# Patient Record
Sex: Male | Born: 1940 | Race: White | Hispanic: No | Marital: Married | Smoking: Never smoker
Health system: Southern US, Community
[De-identification: ages and names within clinical notes are randomized; demographics above are authoritative.]

## PROBLEM LIST (undated history)

## (undated) DIAGNOSIS — K219 Gastro-esophageal reflux disease without esophagitis: Secondary | ICD-10-CM

## (undated) DIAGNOSIS — E119 Type 2 diabetes mellitus without complications: Secondary | ICD-10-CM

## (undated) DIAGNOSIS — I700 Atherosclerosis of aorta: Secondary | ICD-10-CM

## (undated) DIAGNOSIS — I272 Pulmonary hypertension, unspecified: Secondary | ICD-10-CM

## (undated) DIAGNOSIS — F32A Depression, unspecified: Secondary | ICD-10-CM

## (undated) DIAGNOSIS — I4891 Unspecified atrial fibrillation: Secondary | ICD-10-CM

## (undated) DIAGNOSIS — I219 Acute myocardial infarction, unspecified: Secondary | ICD-10-CM

## (undated) DIAGNOSIS — R519 Headache, unspecified: Secondary | ICD-10-CM

## (undated) DIAGNOSIS — M21371 Foot drop, right foot: Secondary | ICD-10-CM

## (undated) DIAGNOSIS — N400 Enlarged prostate without lower urinary tract symptoms: Secondary | ICD-10-CM

## (undated) DIAGNOSIS — Z7901 Long term (current) use of anticoagulants: Secondary | ICD-10-CM

## (undated) DIAGNOSIS — G43909 Migraine, unspecified, not intractable, without status migrainosus: Secondary | ICD-10-CM

## (undated) DIAGNOSIS — I480 Paroxysmal atrial fibrillation: Secondary | ICD-10-CM

## (undated) DIAGNOSIS — Q6102 Congenital multiple renal cysts: Secondary | ICD-10-CM

## (undated) DIAGNOSIS — K449 Diaphragmatic hernia without obstruction or gangrene: Secondary | ICD-10-CM

## (undated) DIAGNOSIS — I714 Abdominal aortic aneurysm, without rupture, unspecified: Secondary | ICD-10-CM

## (undated) DIAGNOSIS — M51369 Other intervertebral disc degeneration, lumbar region without mention of lumbar back pain or lower extremity pain: Secondary | ICD-10-CM

## (undated) DIAGNOSIS — I499 Cardiac arrhythmia, unspecified: Secondary | ICD-10-CM

## (undated) DIAGNOSIS — I2510 Atherosclerotic heart disease of native coronary artery without angina pectoris: Secondary | ICD-10-CM

## (undated) DIAGNOSIS — G5731 Lesion of lateral popliteal nerve, right lower limb: Secondary | ICD-10-CM

## (undated) DIAGNOSIS — I209 Angina pectoris, unspecified: Secondary | ICD-10-CM

## (undated) DIAGNOSIS — K5790 Diverticulosis of intestine, part unspecified, without perforation or abscess without bleeding: Secondary | ICD-10-CM

## (undated) DIAGNOSIS — Z8719 Personal history of other diseases of the digestive system: Secondary | ICD-10-CM

## (undated) DIAGNOSIS — D126 Benign neoplasm of colon, unspecified: Secondary | ICD-10-CM

## (undated) DIAGNOSIS — I10 Essential (primary) hypertension: Secondary | ICD-10-CM

## (undated) DIAGNOSIS — M1990 Unspecified osteoarthritis, unspecified site: Secondary | ICD-10-CM

## (undated) DIAGNOSIS — E785 Hyperlipidemia, unspecified: Secondary | ICD-10-CM

## (undated) DIAGNOSIS — R51 Headache: Secondary | ICD-10-CM

## (undated) HISTORY — PX: CHOLECYSTECTOMY: SHX55

## (undated) HISTORY — PX: BACK SURGERY: SHX140

## (undated) HISTORY — PX: HERNIA REPAIR: SHX51

## (undated) HISTORY — PX: REPLACEMENT TOTAL KNEE: SUR1224

## (undated) HISTORY — PX: CORONARY ANGIOPLASTY: SHX604

## (undated) HISTORY — PX: INGUINAL HERNIA REPAIR: SUR1180

---

## 1999-01-11 ENCOUNTER — Encounter: Payer: Self-pay | Admitting: Neurosurgery

## 1999-01-11 ENCOUNTER — Ambulatory Visit (HOSPITAL_COMMUNITY): Admission: RE | Admit: 1999-01-11 | Discharge: 1999-01-11 | Payer: Self-pay | Admitting: Neurosurgery

## 1999-05-01 ENCOUNTER — Encounter: Payer: Self-pay | Admitting: Neurosurgery

## 1999-05-01 ENCOUNTER — Ambulatory Visit (HOSPITAL_COMMUNITY): Admission: RE | Admit: 1999-05-01 | Discharge: 1999-05-01 | Payer: Self-pay | Admitting: Neurosurgery

## 1999-05-05 ENCOUNTER — Encounter: Admission: RE | Admit: 1999-05-05 | Discharge: 1999-05-14 | Payer: Self-pay | Admitting: Anesthesiology

## 2004-06-16 ENCOUNTER — Ambulatory Visit: Payer: Self-pay

## 2006-05-21 ENCOUNTER — Ambulatory Visit: Payer: Self-pay

## 2006-06-09 ENCOUNTER — Ambulatory Visit: Payer: Self-pay | Admitting: Unknown Physician Specialty

## 2006-06-09 ENCOUNTER — Other Ambulatory Visit: Payer: Self-pay

## 2006-06-16 ENCOUNTER — Ambulatory Visit: Payer: Self-pay | Admitting: Unknown Physician Specialty

## 2006-07-25 ENCOUNTER — Ambulatory Visit: Payer: Self-pay | Admitting: Unknown Physician Specialty

## 2006-08-30 ENCOUNTER — Ambulatory Visit: Payer: Self-pay | Admitting: Unknown Physician Specialty

## 2006-09-01 ENCOUNTER — Inpatient Hospital Stay: Payer: Self-pay | Admitting: Unknown Physician Specialty

## 2006-09-05 ENCOUNTER — Other Ambulatory Visit: Payer: Self-pay

## 2009-11-24 ENCOUNTER — Ambulatory Visit: Payer: Self-pay | Admitting: Unknown Physician Specialty

## 2012-07-13 ENCOUNTER — Inpatient Hospital Stay: Payer: Self-pay | Admitting: Family Medicine

## 2012-07-13 LAB — COMPREHENSIVE METABOLIC PANEL
Albumin: 4 g/dL (ref 3.4–5.0)
Alkaline Phosphatase: 83 U/L (ref 50–136)
Anion Gap: 6 — ABNORMAL LOW (ref 7–16)
BUN: 16 mg/dL (ref 7–18)
Bilirubin,Total: 0.7 mg/dL (ref 0.2–1.0)
Calcium, Total: 8.5 mg/dL (ref 8.5–10.1)
Chloride: 107 mmol/L (ref 98–107)
Co2: 28 mmol/L (ref 21–32)
Creatinine: 0.85 mg/dL (ref 0.60–1.30)
EGFR (African American): >60
EGFR (Non-African Amer.): >60
Glucose: 153 mg/dL — ABNORMAL HIGH (ref 65–99)
Osmolality: 285 (ref 275–301)
Potassium: 3.9 mmol/L (ref 3.5–5.1)
SGOT(AST): 25 U/L (ref 15–37)
SGPT (ALT): 29 U/L (ref 12–78)
Sodium: 141 mmol/L (ref 136–145)
Total Protein: 6.7 g/dL (ref 6.4–8.2)

## 2012-07-13 LAB — CBC
HCT: 35.5 % — ABNORMAL LOW (ref 40.0–52.0)
HGB: 12.3 g/dL — ABNORMAL LOW (ref 13.0–18.0)
MCH: 32.8 pg (ref 26.0–34.0)
MCHC: 34.7 g/dL (ref 32.0–36.0)
MCV: 95 fL (ref 80–100)
Platelet: 161 10*3/uL (ref 150–440)
RBC: 3.75 10*6/uL — ABNORMAL LOW (ref 4.40–5.90)
RDW: 13.8 % (ref 11.5–14.5)
WBC: 5.5 10*3/uL (ref 3.8–10.6)

## 2012-07-13 LAB — CK TOTAL AND CKMB (NOT AT ARMC)
CK, Total: 174 U/L (ref 35–232)
CK-MB: 1.8 ng/mL (ref 0.5–3.6)

## 2012-07-13 LAB — TROPONIN I
Troponin-I: <0.02 ng/mL
Troponin-I: <0.02 ng/mL

## 2012-07-13 LAB — LIPASE, BLOOD: Lipase: 114 U/L (ref 73–393)

## 2012-07-16 LAB — COMPREHENSIVE METABOLIC PANEL
Albumin: 3.1 g/dL — ABNORMAL LOW (ref 3.4–5.0)
Alkaline Phosphatase: 62 U/L (ref 50–136)
Anion Gap: 5 — ABNORMAL LOW (ref 7–16)
BUN: 25 mg/dL — ABNORMAL HIGH (ref 7–18)
Bilirubin,Total: 0.4 mg/dL (ref 0.2–1.0)
Calcium, Total: 8.3 mg/dL — ABNORMAL LOW (ref 8.5–10.1)
Chloride: 105 mmol/L (ref 98–107)
Co2: 27 mmol/L (ref 21–32)
Creatinine: 0.99 mg/dL (ref 0.60–1.30)
EGFR (African American): >60
EGFR (Non-African Amer.): >60
Glucose: 268 mg/dL — ABNORMAL HIGH (ref 65–99)
Osmolality: 288 (ref 275–301)
Potassium: 4.9 mmol/L (ref 3.5–5.1)
SGOT(AST): 20 U/L (ref 15–37)
SGPT (ALT): 20 U/L (ref 12–78)
Sodium: 137 mmol/L (ref 136–145)
Total Protein: 6 g/dL — ABNORMAL LOW (ref 6.4–8.2)

## 2012-07-16 LAB — CBC WITH DIFFERENTIAL/PLATELET
Basophil #: 0 10*3/uL (ref 0.0–0.1)
Basophil %: 0.1 %
Eosinophil #: 0 10*3/uL (ref 0.0–0.7)
HGB: 11.4 g/dL — ABNORMAL LOW (ref 13.0–18.0)
Lymphocyte #: 0.7 10*3/uL — ABNORMAL LOW (ref 1.0–3.6)
MCH: 32.6 pg (ref 26.0–34.0)
Monocyte #: 0.5 x10 3/mm (ref 0.2–1.0)
RBC: 3.5 10*6/uL — ABNORMAL LOW (ref 4.40–5.90)

## 2012-07-19 ENCOUNTER — Ambulatory Visit: Payer: Self-pay | Admitting: Unknown Physician Specialty

## 2012-08-15 ENCOUNTER — Ambulatory Visit: Payer: Self-pay | Admitting: Surgery

## 2012-08-21 ENCOUNTER — Ambulatory Visit: Payer: Self-pay | Admitting: Surgery

## 2012-08-22 LAB — PATHOLOGY REPORT

## 2013-08-06 ENCOUNTER — Inpatient Hospital Stay: Payer: Self-pay | Admitting: Internal Medicine

## 2013-08-06 DIAGNOSIS — I214 Non-ST elevation (NSTEMI) myocardial infarction: Secondary | ICD-10-CM

## 2013-08-06 HISTORY — DX: Non-ST elevation (NSTEMI) myocardial infarction: I21.4

## 2013-08-06 LAB — BASIC METABOLIC PANEL
Anion Gap: 5 — ABNORMAL LOW (ref 7–16)
BUN: 13 mg/dL (ref 7–18)
CHLORIDE: 107 mmol/L (ref 98–107)
CREATININE: 0.88 mg/dL (ref 0.60–1.30)
Calcium, Total: 8.9 mg/dL (ref 8.5–10.1)
Co2: 30 mmol/L (ref 21–32)
EGFR (Non-African Amer.): >60
Glucose: 165 mg/dL — ABNORMAL HIGH (ref 65–99)
Osmolality: 287 (ref 275–301)
Potassium: 3.7 mmol/L (ref 3.5–5.1)
SODIUM: 142 mmol/L (ref 136–145)

## 2013-08-06 LAB — CBC
HCT: 37.2 % — ABNORMAL LOW (ref 40.0–52.0)
HGB: 12.8 g/dL — ABNORMAL LOW (ref 13.0–18.0)
MCH: 32.9 pg (ref 26.0–34.0)
MCHC: 34.4 g/dL (ref 32.0–36.0)
MCV: 96 fL (ref 80–100)
Platelet: 149 10*3/uL — ABNORMAL LOW (ref 150–440)
RBC: 3.88 10*6/uL — ABNORMAL LOW (ref 4.40–5.90)
RDW: 13.9 % (ref 11.5–14.5)
WBC: 4 10*3/uL (ref 3.8–10.6)

## 2013-08-06 LAB — TROPONIN I
TROPONIN-I: 0.28 ng/mL — AB
TROPONIN-I: 0.3 ng/mL — AB
Troponin-I: 0.14 ng/mL — ABNORMAL HIGH
Troponin-I: 0.31 ng/mL — ABNORMAL HIGH

## 2013-08-06 LAB — HEPATIC FUNCTION PANEL A (ARMC)
AST: 23 U/L (ref 15–37)
Albumin: 3.7 g/dL (ref 3.4–5.0)
Alkaline Phosphatase: 77 U/L
BILIRUBIN DIRECT: 0.2 mg/dL (ref 0.00–0.20)
BILIRUBIN TOTAL: 0.6 mg/dL (ref 0.2–1.0)
SGPT (ALT): 34 U/L (ref 12–78)
TOTAL PROTEIN: 6.7 g/dL (ref 6.4–8.2)

## 2013-08-06 LAB — CK TOTAL AND CKMB (NOT AT ARMC)
CK, TOTAL: 120 U/L
CK, Total: 124 U/L
CK, Total: 92 U/L
CK-MB: 2.6 ng/mL (ref 0.5–3.6)
CK-MB: 2.7 ng/mL (ref 0.5–3.6)
CK-MB: 1.3 ng/mL (ref 0.5–3.6)

## 2013-08-06 LAB — CK-MB
CK-MB: 2.9 ng/mL (ref 0.5–3.6)
CK-MB: 2.1 ng/mL (ref 0.5–3.6)
CK-MB: 1.7 ng/mL (ref 0.5–3.6)

## 2013-08-06 LAB — APTT
ACTIVATED PTT: 97.5 s — AB (ref 23.6–35.9)
Activated PTT: 37.5 secs — ABNORMAL HIGH (ref 23.6–35.9)
Activated PTT: 128.4 secs — ABNORMAL HIGH (ref 23.6–35.9)

## 2013-08-06 LAB — PROTIME-INR
INR: 1
Prothrombin Time: 13.4 secs (ref 11.5–14.7)

## 2013-08-06 LAB — LIPASE, BLOOD: LIPASE: 131 U/L (ref 73–393)

## 2013-08-06 LAB — CK: CK, Total: 103 U/L

## 2013-08-07 DIAGNOSIS — R079 Chest pain, unspecified: Secondary | ICD-10-CM

## 2013-08-07 HISTORY — PX: CORONARY ANGIOPLASTY WITH STENT PLACEMENT: SHX49

## 2013-08-07 LAB — LIPID PANEL
CHOLESTEROL: 155 mg/dL (ref 0–200)
HDL: 43 mg/dL (ref 40–60)
LDL CHOLESTEROL, CALC: 98 mg/dL (ref 0–100)
Triglycerides: 72 mg/dL (ref 0–200)
VLDL Cholesterol, Calc: 14 mg/dL (ref 5–40)

## 2013-08-07 LAB — PLATELET COUNT: PLATELETS: 144 10*3/uL — AB (ref 150–440)

## 2013-08-07 LAB — APTT: Activated PTT: 106.4 secs — ABNORMAL HIGH (ref 23.6–35.9)

## 2013-08-07 LAB — HEMOGLOBIN: HGB: 12.7 g/dL — ABNORMAL LOW (ref 13.0–18.0)

## 2013-08-07 LAB — CK-MB: CK-MB: 3.1 ng/mL (ref 0.5–3.6)

## 2013-08-08 LAB — BASIC METABOLIC PANEL
Anion Gap: 4 — ABNORMAL LOW (ref 7–16)
BUN: 20 mg/dL — AB (ref 7–18)
Calcium, Total: 8.1 mg/dL — ABNORMAL LOW (ref 8.5–10.1)
Chloride: 107 mmol/L (ref 98–107)
Co2: 30 mmol/L (ref 21–32)
Creatinine: 1.01 mg/dL (ref 0.60–1.30)
EGFR (African American): >60
Glucose: 165 mg/dL — ABNORMAL HIGH (ref 65–99)
OSMOLALITY: 288 (ref 275–301)
Potassium: 4.5 mmol/L (ref 3.5–5.1)
SODIUM: 141 mmol/L (ref 136–145)

## 2013-08-27 ENCOUNTER — Ambulatory Visit: Payer: Self-pay | Admitting: Unknown Physician Specialty

## 2013-12-21 ENCOUNTER — Other Ambulatory Visit: Payer: Self-pay | Admitting: Internal Medicine

## 2013-12-21 LAB — TROPONIN I: Troponin-I: <0.02 ng/mL

## 2014-06-05 ENCOUNTER — Ambulatory Visit: Payer: Self-pay | Admitting: General Practice

## 2014-06-12 ENCOUNTER — Ambulatory Visit: Payer: Self-pay | Admitting: General Practice

## 2014-06-19 ENCOUNTER — Inpatient Hospital Stay: Payer: Self-pay | Admitting: General Practice

## 2014-06-27 DIAGNOSIS — M1712 Unilateral primary osteoarthritis, left knee: Secondary | ICD-10-CM | POA: Diagnosis not present

## 2014-06-27 DIAGNOSIS — I10 Essential (primary) hypertension: Secondary | ICD-10-CM

## 2014-06-27 DIAGNOSIS — E119 Type 2 diabetes mellitus without complications: Secondary | ICD-10-CM | POA: Diagnosis not present

## 2014-06-27 DIAGNOSIS — N400 Enlarged prostate without lower urinary tract symptoms: Secondary | ICD-10-CM | POA: Diagnosis not present

## 2014-06-27 DIAGNOSIS — I2510 Atherosclerotic heart disease of native coronary artery without angina pectoris: Secondary | ICD-10-CM | POA: Diagnosis not present

## 2014-08-02 NOTE — Consult Note (Signed)
PATIENT NAME:  Joseph Mann, Joseph Mann MR#:  [id number] DATE OF BIRTH:  1941-04-05  DATE OF CONSULTATION:  07/14/2012  REFERRING PHYSICIAN:  Rochel Brome, MD CONSULTING PHYSICIAN:  Ocie Cornfield. Ouida Sills, MD  REASON FOR CONSULTATION:  Rash.  HISTORY OF PRESENT ILLNESS: Joseph Mann is a pleasant 74 year old male who was admitted to the hospital a day and a half ago, early in the morning, with severe abdominal pain. Thought to be cholecystitis.  Was having plans to have a cholecystectomy done when he developed more and more of a rash systemically. It was on his chest and his head on admission. It spread to his arms and back and legs as well. Notes he did have amoxicillin from a walk-in clinic that finished about the time of admission to the hospital. No known history of allergy to penicillin. Has a history of allergy to IVP dye causing headache. He has had that since he was here, but again rash was present prior to being here and did not have a rash present prior. He has had some swelling of his hand, particularly of his right hand, some in his left knuckles as well. He is breathing well, but he is having some dysphagia. Dysphagia seems to be abating somewhat. He can swallow his water in the room at this point. His abdominal pain is present, but not nearly as severe.   PAST MEDICAL HISTORY: 1.  Hypertension.  2.  BPH.  3.  Hypercholesterolemia.  4.  Migraines, controlled with avoidance.  5.  Diet-controlled diabetes, not on medications at this point.  PAST SURGICAL HISTORY: 1.  Bilateral hernia repair in 1998. 2.  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  FAMILY HISTORY: Positive for hypertension.  SOCIAL HISTORY: Married, 2 kids. No tobacco. No alcohol. He is retired.     PHYSICAL EXAMINATION: GENERAL: Pleasant, no acute distress. Some obvious rash is present. VITAL SIGNS: Blood pressure is 123/74, pulse 74 and regular, respirations 18 and temperature 99.  HEENT: Normocephalic, atraumatic. OP  clear. No lesions in his mouth. NECK:  No lymphadenopathy  No JVD.  CHEST:  Clear to auscultation and percussion and inspection. No stridor. No wheezing.  HEART: Regular rate and rhythm without murmurs or gallops.  ABDOMEN: Soft and flat. Mild epigastric and right upper quadrant tenderness. No rebound or guarding.  EXTREMITIES: No clubbing, cyanosis or edema.  SKIN: Diffuse macular rash with some target lesions, worse on arms, buttocks and upper legs.   LABORATORY AND DIAGNOSTICS:  Abdominal ultrasound with biliary sludge. Lipase normal. Chest x-ray has been normal. WBC is 5.5 and hemoglobin is 12.3.  ASSESSMENT AND PLAN:  Rash, likely erythema multiforme. Will give him higher dose steroids with Solu-Medrol, 125 mg q. 8 hours. Benadryl p.r.n. is fine. Watch him closely. Would not discharge to ensure his airway stays open, especially since he has some dysphagia, although again that seems to be clearing as well. Seems to be from the amoxicillin so will enter penicillin allergy going forward.  ____________________________ Ocie Cornfield. Ouida Sills, MD mwa:sb D: 07/14/2012 14:11:55 ET T: 07/14/2012 14:33:58 ET JOB#: [id number]  cc: Ocie Cornfield. Ouida Sills, MD, <Dictator> Kirk Ruths MD ELECTRONICALLY SIGNED 07/17/2012 17:46

## 2014-08-02 NOTE — Op Note (Signed)
PATIENT NAME:  Joseph Mann, Joseph Mann MR#:  [id number] DATE OF BIRTH:  1940-12-26  DATE OF PROCEDURE:  08/21/2012  PREOPERATIVE DIAGNOSIS: Chronic cholecystitis.   POSTOPERATIVE DIAGNOSIS: Chronic cholecystitis, cholelithiasis.   PROCEDURE: Laparoscopic cholecystectomy, cholangiogram.   SURGEON: Rochel Brome, M.D.   ANESTHESIA: General.   INDICATIONS: This 73 year old male has a history of right upper quadrant pain, ultrasound findings of sludge, and surgery was recommended for definitive treatment.   DESCRIPTION OF PROCEDURE: The patient was placed on the operating table in the supine position under general anesthesia. The abdomen was prepared with ChloraPrep and draped in a sterile manner. A short incision was made in the inferior aspect of the umbilicus and carried down to the deep fascia. There was an umbilical hernia identified. This sac was dissected free from surrounding structures. The sac itself was approximately 1.5 cm in dimension. The fascial ring defect was approximately 7 mm in dimension. The sac was inverted. The Veress needle was inserted. The peritoneal cavity was inflated with carbon dioxide. The Veress needle was removed. The 10 mm cannula was inserted. The 10 mm, 0 degree laparoscope was inserted to view the peritoneal cavity. The liver appeared normal. There were no other specific findings with a brief survey of the abdomen. The patient was placed in the reverse Trendelenburg position and several degrees to the left. Next, an incision was made in the epigastrium slightly to the right of the midline to introduce an 11 mm cannula. Two incisions were made in the lateral aspect of the right upper quadrant to introduce two 5 mm cannulas.   The gallbladder was dissected. It was retracted towards the right shoulder. Multiple adhesions were taken down with blunt and sharp dissection. The infundibulum was retracted inferiorly and laterally. The cystic duct was dissected free from surrounding  structures and appeared to have the cystic artery in close proximity. The porta hepatis was demonstrated. The neck of the gallbladder was mobilized with blunt and sharp dissection. A critical view of safety was demonstrated. An Endo Clip was placed across the cystic duct and across the cystic artery adjacent to the neck of the gallbladder. An incision was made in the cystic duct to introduce a Reddick catheter. Half-strength Conray 60 dye was injected as the cholangiogram was done with fluoroscopy, viewing the biliary tree and flow of dye into the duodenum. No retained stones were seen. The Reddick catheter was removed. The cystic duct was doubly ligated with Endo Clips and divided. This also included the cystic artery. Next, the gallbladder was dissected free from surrounding structures. One other point that may be ducts of Luschka was controlled with Endo Clip and divided. The gallbladder had some bile leakage, which was aspirated, and the gallbladder was subsequently completely dissected free from the liver. The site was irrigated with heparinized saline solution and aspirated. The gallbladder was placed into an Endo Catch bag and delivered out through the umbilical port site. It did contain some palpable small stones and was submitted in formalin for routine pathology. The right upper quadrant was further inspected, irrigated and aspirated. The cannulas were removed. Carbon dioxide was allowed to escape from the peritoneal cavity. The fascial defect at the umbilicus was closed with a 0 Maxon inverted figure-of-eight suture and 1 additional inverted simple suture. Next, the skin incisions were closed with interrupted 5-0 chromic subcuticular sutures, benzoin and Steri-Strips. Dressings were applied with paper tape. The patient tolerated surgery satisfactorily and was moved to the recovery room for postoperative care.   ____________________________  Loreli Dollar, MD jws:jm D: 08/21/2012 11:05:34  ET T: 08/21/2012 13:46:39 ET JOB#: [id number]  cc: Loreli Dollar, MD, <Dictator> Loreli Dollar MD ELECTRONICALLY SIGNED 08/23/2012 17:53

## 2014-08-02 NOTE — H&P (Signed)
PATIENT NAME:  Joseph Mann, Joseph Mann MR#:  [id number] DATE OF BIRTH:  04/01/1941  DATE OF ADMISSION:  07/13/2012  HISTORY OF PRESENT ILLNESS: This 73 year old male came in emergently to the Emergency Room with chief complaint of pain, which he points to the right posterolateral lower ribcage, which also extended up into the right upper quadrant of the abdomen. He described this as a severe pain, which has been colicky. He has also had associated anorexia, nausea and dry heaves, but no vomiting. Has had some indigestion, not heartburn. He reports no chills or fever. Says the pain started yesterday. He had had some diarrhea during the course of the day, which eventually resolved, and by 11 o'clock last night, the pain was quite severe. He has had been evaluated by the Emergency Room staff and has been given some intravenous morphine, which helped. He was also given nitroglycerin, which did not help and also had been given Zofran, which helped. Has had ultrasound of the gallbladder, which I have reviewed the images, which demonstrate sludge in the dependant portion of the gallbladder. He also had a chest x-ray, which I have reviewed, which shows clear lung fields. He has had a CT scan of the chest and abdomen with contrast, which showed no evidence of any large vessel dissection.   PAST MEDICAL HISTORY: Include: 1.  Hypertension and is doing well on treatment.  2.  Hypercholesterolemia.  3.  History of headaches.  4.  History of hyperglycemia but not treated for diabetes.  5.  History of benign prostatic hypertrophy and history of prostatitis.  6.  Has had lumbar back surgery.  7.  Bilateral inguinal hernia repair.  8.  Left elbow surgery for nerve entrapment.   MEDICATIONS: Include:  1.  Aspirin 81 mg daily.  2.  Pravastatin 20 mg at bedtime.  3.  Viagra p.r.n.  4.  Cardura 8 mg at bedtime.   DRUG ALLERGIES: TO IVP DYE, LOVASTATIN, LIPITOR.   FAMILY HISTORY: Positive for hypertension and gallbladder  disease.   SOCIAL HISTORY: Accompanied by his wife and daughter. Does not smoke. Does not drink any alcohol. 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   REVIEW OF SYSTEMS: He reports that normally moves his bowels satisfactorily and voids satisfactorily. Had recently had some sinus congestion and took amoxicillin. Has developed a rash involving his arms, back and neck. Has taken some Benadryl, which helped. He reports no recent visual or auditory problems. No recent acute illness, such as cold or sore throat, but just the sinus infection. He reports no difficulty breathing with exertion. No chest pains. He reports he voids satisfactorily. He reports no ankle edema. No other recent sores or boils. Review of systems otherwise negative.   PHYSICAL EXAMINATION: GENERAL: He is awake, alert, oriented, resting on the ER stretcher.  VITAL SIGNS: Temperature is 97.8, pulse 52, respirations 18, blood pressure 119/67, weight 212, height 74 inches  SKIN: Warm and dry. Does have a macular splotched red rash involving arms, back and neck. Does have a palpable nodule left aspect of his back, which is about 2 cm with a central pore, possibly a sebaceous cyst. Also another nodule on the left side of his neck, which is about 1.2 cm, possibly another sebaceous cyst.  HEENT: His pupils are equal and reactive to light. Extraocular movements are intact. Sclerae clear. Pharynx clear.  NECK: With no other palpable mass other than that noted above.  LUNGS: Sounds are clear. No respiratory distress.  HEART: Regular  rhythm. S1 and S2.  ABDOMEN: Soft and flat. Has a very small umbilical hernia defect. Minimal degree of right upper quadrant tenderness. No palpable mass. No hepatomegaly.  EXTREMITIES: With no dependent edema.  NEUROLOGIC: Awake, alert, oriented. Moving all extremities.   RADIOLOGIC DATA: I reviewed his EKG, which has demonstrated sinus bradycardia.   LABORATORY DATA: I noted his lab work. His creatinine is  0.85. Liver panel normal. Cardiac enzymes normal. Hemoglobin 12.3. Ultrasound reviewed.   IMPRESSION: Cholecystitis, cholelithiasis.  RECOMMENDATIONS: I recommend admission to the hospital for IV fluids hydration and pain control, set him up for a laparoscopic cholecystectomy. I have discussed the operation, care, risks and benefits with him in detail, and we will get this scheduled.    ____________________________ Lenna Sciara. Rochel Brome, MD jws:lg D: 07/13/2012 13:01:06 ET T: 07/13/2012 13:40:06 ET JOB#: [id number]  cc: Loreli Dollar, MD, <Dictator> Ocie Cornfield. Ouida Sills, MD Loreli Dollar MD ELECTRONICALLY SIGNED 07/14/2012 17:50

## 2014-08-02 NOTE — Discharge Summary (Signed)
  DATE OF BIRTH:  August 04, 1940  DATE OF ADMISSION:  07/13/2012  DATE OF DISCHARGE:  07/16/2012  DISCHARGE DIAGNOSES: 1.  Right upper quadrant abdominal pain, likely cholecystitis.  2.  Acute allergic reaction, likely drug reaction due to amoxicillin.  3.  Hyperlipidemia.  4.  Benign prostatic hypertrophy.  5.  Hypertension.   DISCHARGE MEDICATIONS: 1.  Doxazosin 8 mg p.o. at bedtime.  2.  Pravastatin 20 mg p.o. at bedtime. 3.  Prednisone taper as directed.  4.  Hydroxyzine 25 mg p.o. q. 6 hours as needed for itching.  5.  Zyrtec 10 mg daily, as long as he had the allergic reaction symptoms.  6.  Benadryl 50 mg q. 8 hours as needed for itching.   CONSULTS:  Surgery.   PROCEDURES:  None.   PERTINENT LABS ON DAY OF DISCHARGE:  Sodium 137, potassium 4.9, creatinine 0.99, glucose 268. White blood cell count 11.5, hemoglobin 11.4, platelets 175. Cardiac enzymes were negative. AST 20, ALT 20, total bilirubin 0.4, alk phos is 62.   BRIEF HOSPITAL COURSE:  1.  Right upper quadrant abdominal pain: The patient initially came in with right upper quadrant abdominal pain concerning for possible cholecystitis. He was seen by Surgery and admitted for potential cholecystectomy. His ultrasound showed some sludge, but no gallbladder wall thickening, no gallstones, no signs of acute cholecystitis. His symptoms did improve. He was placed on a low fat diet and put on pain control. He did not have any further nausea or vomiting with this, and his abdominal pain significantly improved.   2.  During all of this, he developed an allergic reaction. He was previously on amoxicillin prior to admission, and it was thought that this was likely a drug reaction. He had both hives on the left thigh and the right arm. He was seen per request of Dr. Tamala Julian for medical consult by Dr. Frazier Richards. Dr. Ouida Sills placed him on high-dose Solu-Medrol IV, and his symptoms improved quickly. On day of discharge, his only  complaint was itching, but hives have resolved. Our plan here was to transition him over to a prednisone taper, which he will do, and cover his symptoms with hydroxyzine, Zyrtec and Benadryl as needed. Once his symptoms are resolved, he may consider further surgical workup per Dr. Tamala Julian. Will defer that to him.   DISPOSITION:  He is in stable condition to be discharged to home. Follow up with Dr. Ouida Sills as scheduled already. Follow up with Dr. Tamala Julian once his symptoms resolve.     Other chronic medical issues remain stable. No changes to those regimens.    ____________________________ Dion Body, MD kl:mr D: 07/16/2012 10:24:00 ET T: 07/16/2012 18:53:14 ET JOB#: [id number]  Dion Body, MD, <Dictator> cc:  Ocie Cornfield. Ouida Sills, MD J. Rochel Brome, MD    Lucianne Muss Netty Starring MD ELECTRONICALLY SIGNED 07/21/2012 10:01

## 2014-08-03 NOTE — H&P (Signed)
PATIENT NAME:  Joseph Mann, Joseph Mann MR#:  [id number] DATE OF BIRTH:  [birth year]  DATE OF ADMISSION:  08/06/2013  REFERRING PHYSICIAN: Dr. Beather Arbour.  PRIMARY CARE PHYSICIAN: Dr. Ouida Sills of [facility].   CHIEF COMPLAINT: Chest pain.   HISTORY OF PRESENT ILLNESS: A 74 year old Caucasian gentleman with a history of hypertension, pre-diabetes, hyperlipidemia, presenting with chest pain. Describes a 2-day duration of intermittent chest pain, retrosternal in location, radiation to the left arm associated shortness of breath. However, denies pain, nausea, diaphoresis, palpitations, orthopnea, or lower extremity edema. These episodes all occurred at rest. He had 3 episodes over a period of 2 days, each lasting about an hour in total, or possibly somewhat more. Each episode was worse in severity until finally this evening's episode where chest pain was 10/10 retrosternal with radiation to the left arm with associated shortness of breath. Thus presents to the emergency department for further workup and evaluation. He denies any worsening or relieving factors. He does, however, attest to some anginal symptoms with exertion at baseline.   REVIEW OF SYSTEMS:  CONSTITUTIONAL: Denies fever, fatigue, weakness. EYES: Denies blurry, double vision, or eye pain.  HEENT: Denies tinnitus, ear pain, hearing loss.  RESPIRATORY: Denies cough, wheeze, shortness of breath. CARDIOVASCULAR: Positive for chest pain as described above. Denies any palpitations, edema.  GASTROINTESTINAL: Positive for nausea, denies vomiting, diarrhea, abdominal pain.  GENITOURINARY: Denies dysuria or hematuria.  ENDOCRINE: Denies nocturia or thyroid problems.  HEMATOLOGIC AND LYMPHATIC: Denies easy bruising, bleeding. SKIN: Denies rash or lesions. MUSCULOSKELETAL: Denies pain in neck, back, shoulders, knees, or hips, or arthritic symptoms. NEUROLOGIC: Denies paralysis or paresthesias.  PSYCHIATRIC: Denies anxiety or depressive symptoms.    Otherwise, full review of systems performed by me is negative.   PAST MEDICAL HISTORY: Pre diabetes, hyperlipidemia, hypertension, BPH.   SOCIAL HISTORY: Denies any tobacco or drug usage. Positive for occasional alcohol usage.   FAMILY HISTORY: Positive for coronary artery disease, late onset.   ALLERGIES: AMOXICILLIN, CONTRAST DYE.   HOME MEDICATIONS: Include doxazosin 8 mg p.o. at bedtime, Norco 325/5 mg p.o. q. 6 hours, Prilosec 20 mg p.o. at bedtime, Zyrtec 10 mg p.o. daily.   PHYSICAL EXAMINATION:  VITAL SIGNS: Temperature 98.1, heart rate 53, respirations 12, blood pressure 147/80, saturating 96% on room air. Weight 97.5 kg, BMI 27.6.  GENERAL: Well-nourished, well-developed, Caucasian gentleman, currently in no acute distress.  HEAD: Normocephalic, atraumatic.  EYES: Pupils equal, round, reactive to light. Extraocular muscles intact. No scleral icterus.  MOUTH: Moist mucous membranes.  Dentition intact. No abscess noted.  EARS, NOSE, AND THROAT: Clear without exudates. No external lesions.  NECK: Supple. No thyromegaly. No nodules. No JVD.  PULMONARY: Clear to auscultation bilaterally without wheezes, rales, or rhonchi. No use of accessory muscles. Good respiratory effort.  CHEST:  Nontender on palpation.  CARDIOVASCULAR: S1, S2, bradycardic. No murmurs, rubs, or gallops. No edema. Pedal pulses 2+ bilaterally.  GASTROINTESTINAL: Soft, nontender, nondistended. There are no masses. Positive bowel sounds. No hepatosplenomegaly.  MUSCULOSKELETAL: No swelling, clubbing, or edema. Range of motion is full in all extremities. NEUROLOGIC: Cranial nerves II through XII intact. No gross neurologic deficits. Sensation intact. Reflexes intact.  SKIN: No ulcerations, lesions, no rashes, or cyanosis. Skin warm and dry. Turgor intact. PSYCHIATRIC: Mood and affect within normal limits. The patient alert and oriented x 3. Insight and judgment intact.   LABORATORY DATA: EKG performed revealing  sinus bradycardia without ST or T wave abnormalities. Chest x-ray performed revealing no acute cardiopulmonary process.  Remainder of laboratory data: Sodium 142, potassium 3.7, chloride 107, bicarbonate 30, BUN 13, creatinine 0.88, glucose 165. LFTs within normal limits. Troponin I 0.14. WBC 4, hemoglobin 12.8, platelets of 149,000. INR of 1, PTT is 37.5.   ASSESSMENT AND PLAN: A 74 year old gentleman presenting with chest pain. 1. Non-ST segment elevation myocardial infarction, received aspirin continue aspirin and statin therapy, placed on cardiac telemetry. Trend cardiac enzymes x 3. Check transthoracic echocardiogram. Continue anticoagulation with heparin drip and consult cardiology with [facility].  2. Hyperglycemia, insulin sliding scale with q. 6-hour Accu-Cheks.  3. Hyperlipidemia. Continue with statin therapy. 4. Venous thromboembolism prophylaxis on a heparin drip. The patient is full code.   TIME SPENT: 45 minutes.    ____________________________ Aaron Mose. Zulay Corrie, MD dkh:lt D: 08/06/2013 02:00:12 ET T: 08/06/2013 06:29:12 ET JOB#: [id number]  cc: Aaron Mose. Chakira Jachim, MD, <Dictator> Kenzel Ruesch Woodfin Ganja MD ELECTRONICALLY SIGNED 08/06/2013 20:29

## 2014-08-03 NOTE — Consult Note (Signed)
Chief Complaint:  Subjective/Chief Complaint Pt doing great this am. Denies cp or sob. He has ambulated well in hall . No groin trouble. Ready to go home.   VITAL SIGNS/ANCILLARY NOTES: **Vital Signs.:   29-Apr-15 08:00  Vital Signs Type Routine  Temperature Temperature (F) 97.9  Celsius 36.6  Temperature Source oral  Pulse Pulse 54  Pulse source if not from Vital Sign Device per cardiac monitor  Respirations Respirations 17  Systolic BP Systolic BP 045  Diastolic BP (mmHg) Diastolic BP (mmHg) 70  Mean BP 84  BP Source  if not from Vital Sign Device non-invasive  Pulse Ox % Pulse Ox % 98  Pulse Ox Activity Level  At rest  Oxygen Delivery Room Air/ 21 %  Pulse Ox Heart Rate 56  *Intake and Output.:   29-Apr-15 08:30  Grand Totals Intake:  240 Output:  200    Net:  40 24 Hr.:  40  Oral Intake      In:  240  Urine ml     Out:  200  Urinary Method  Urinal   Brief Assessment:  GEN well developed, well nourished, no acute distress, cachectic   Cardiac Regular  no murmur  -- LE edema  -- JVD   Respiratory normal resp effort  clear BS   Gastrointestinal Normal   Gastrointestinal details normal Soft  Nontender  Nondistended   EXTR negative cyanosis/clubbing, negative edema   Lab Results: Cardiology:  28-Apr-15 06:57   Ventricular Rate 56  Atrial Rate 56  P-R Interval 206  QRS Duration 92  QT 470  QTc 453  P Axis 43  R Axis 27  T Axis -11  ECG interpretation Sinus bradycardia T wave abnormality, consider anterolateral ischemia Abnormal ECG When compared with ECG of 05-Aug-2013 23:53, T wave inversion now evident in Anterior leads Confirmed by Rockey Situ, TIMOTHY ([id number]) on 08/08/2013 7:52:06 AM  Overreader: Ida Rogue    12:52   Ventricular Rate 49  Atrial Rate 49  P-R Interval 200  QRS Duration 94  QT 494  QTc 446  P Axis 56  R Axis 62  T Axis -21  ECG interpretation Sinus bradycardia T wave abnormality, consider inferior ischemia T wave  abnormality, consider anterolateral ischemia Abnormal ECG When compared with ECG of 07-Aug-2013 06:57, No significant change was found ----------unconfirmed----------  Confirmed by OVERREAD, NOT ([id number]), editor PEARSON, BARBARA ([id number]) on 08/07/2013 3:31:36 PM  Routine Chem:  28-Apr-15 03:38   Cholesterol, Serum 155  Triglycerides, Serum 72  HDL (INHOUSE) 43  VLDL Cholesterol Calculated 14  LDL Cholesterol Calculated 98 (Result(s) reported on 07 Aug 2013 at 04:12AM.)  29-Apr-15 03:44   Glucose, Serum  165  BUN  20  Creatinine (comp) 1.01  Sodium, Serum 141  Potassium, Serum 4.5  Chloride, Serum 107  CO2, Serum 30  Calcium (Total), Serum  8.1  Anion Gap  4  Osmolality (calc) 288  eGFR (African American) >60  eGFR (Non-African American) >60 (eGFR values <33m/min/1.73 m2 may be an indication of chronic kidney disease (CKD). Calculated eGFR is useful in patients with stable renal function. The eGFR calculation will not be reliable in acutely ill patients when serum creatinine is changing rapidly. It is not useful in  patients on dialysis. The eGFR calculation may not be applicable to patients at the low and high extremes of body sizes, pregnant women, and vegetarians.)  Cardiac:  28-Apr-15 20:17   CPK-MB, Serum 3.1 (Result(s) reported on 07 Aug 2013 at 10:12PM.)  Routine Coag:  28-Apr-15 03:38   Activated PTT (APTT)  106.4 (A HCT value >55% may artifactually increase the APTT. In one study, the increase was an average of 19%. Reference: "Effect on Routine and Special Coagulation Testing Values of Citrate Anticoagulant Adjustment in Patients with High HCT Values." American Journal of Clinical Pathology 2006;126:400-405.)  Routine Hem:  28-Apr-15 03:38   Platelet Count (CBC)  144 (Result(s) reported on 07 Aug 2013 at 01:06PM.)  Hemoglobin (CBC)  12.7 (Result(s) reported on 07 Aug 2013 at 01:06PM.)   Radiology Results: XRay:    27-Apr-15 00:30, Chest Portable Single View   Chest Portable Single View   REASON FOR EXAM:    CP  COMMENTS:       PROCEDURE: DXR - DXR PORTABLE CHEST SINGLE VIEW  - Aug 06 2013 12:30AM     CLINICAL DATA:  Chest pain.    EXAM:  PORTABLE CHEST - 1 VIEW    COMPARISON:  Chest radiograph performed 07/13/2012    FINDINGS:  The lungs are well-aerated and clear. There is no evidence of focal  opacification, pleural effusion or pneumothorax.  The cardiomediastinal silhouette is within normal limits. No acute  osseous abnormalities are seen. Chronic right-sided rib deformities  are seen.     IMPRESSION:  No acute cardiopulmonary process seen.      Electronically Signed    By: Garald Balding M.D.    On: 08/06/2013 00:48         Verified By: JEFFREY . Radene Knee, M.D.,  Cardiology:    26-Apr-15 23:53, ED ECG  Ventricular Rate 63  Atrial Rate 63  P-R Interval 196  QRS Duration 88  QT 392  QTc 401  P Axis 40  R Axis 14  T Axis 1  ECG interpretation   Normal sinus rhythm  Nonspecific T wave abnormality  Abnormal ECG  When compared with ECG of 13-Jul-2012 09:07,  T wave inversion now evident in Inferior leads  Nonspecific T wave abnormality now evident in Anterior leads  ----------unconfirmed----------  Confirmed by OVERREAD, NOT ([id number]), editor PEARSON, BARBARA ([id number]) on 08/06/2013 3:25:59 PM  ED ECG     27-Apr-15 14:16, Echo Doppler  Echo Doppler   REASON FOR EXAM:      COMMENTS:       PROCEDURE: [place] - ECHO DOPPLER COMPLETE(TRANSTHOR)  - Aug 06 2013  2:16PM     RESULT: Echocardiogram Report    Patient Name:   GENTLE HOGE Date of Exam: 08/06/2013  Medical Rec #:  [id number]         Custom1:  Date of Birth:  1940/12/27      Height:       76.0 in  Patient Age:    74 years       Weight:       215.0 lb  Patient Gender: M              BSA:          2.28 m??    Indications: MI  Sonographer:    Sherrie Sport RDCS  Referring Phys: Valentino Nose, K    Summary:   1. Left ventricular ejection fraction, by visual estimation, is 70  to   75%.   2. Normal global left ventricular systolic function.   3. Mildly increased left ventricular septal thickness.   4. Mild aortic valve sclerosis without stenosis.   5. Mildly increased left ventricular posterior wall thickness.   6. Mild tricuspid regurgitation.  2D AND  M-MODE MEASUREMENTS (normal ranges within parentheses):  Left Ventricle:          Normal  IVSd (2D):      1.58 cm (0.7-1.1)  LVPWd (2D):     1.18 cm (0.7-1.1) Aorta/LA:                  Normal  LVIDd (2D):     4.54 cm (3.4-5.7) Aortic Root (2D): 3.40 cm (2.4-3.7)  LVIDs (2D):     2.54 cm           Left Atrium (2D): 5.00 cm (1.9-4.0)  LV FS (2D):     44.1 %   (>25%)  LV EF (2D):     75.4 %   (>50%)                                    Right Ventricle:                                    RVd (2D):        9.51 cm  LV DIASTOLIC FUNCTION:  MV Peak E: 0.55 m/s E/e' Ratio: 12.00  MV Peak A: 0.88 m/s Decel Time: 285 msec  E/A Ratio: 0.62  SPECTRAL DOPPLER ANALYSIS (where applicable):  Mitral Valve:  MV P1/2 Time: 82.65 msec  MV Area, PHT: 2.66 cm??  Aortic Valve: AoV Max Vel: 1.03 m/s AoV Peak PG: 4.3 mmHg AoV Mean PG:  LVOT Vmax: 1.11 m/s LVOT VTI:  LVOT Diameter: 2.10 cm  AoV Area, Vmax: 3.72 cm?? AoV Area, VTI:  AoV Area, Vmn:  Tricuspid Valve and PA/RV Systolic Pressure: TR Max Velocity: 2.37 m/s RA   Pressure: 5 mmHg RVSP/PASP: 27.4 mmHg  Pulmonic Valve:  PV Max Velocity: 1.54 m/s PV Max PG: 9.4 mmHg PV Mean PG:    PHYSICIAN INTERPRETATION:  Left Ventricle: The left ventricular internal cavity size was normal. LV   septal wall thickness was mildly increased. LV posterior wall thickness   was mildly increased. Global LV systolic function was normal. Left   ventricular ejection fraction, by visual estimation, is 70 to 75%.  Right Ventricle: The right ventricular size is normal. Global RV systolic   function is normal.  Left Atrium: The left atrium is normal in size.  Right Atrium: The right atrium is normal  in size.  Pericardium: There is no evidence of pericardial effusion.  Mitral Valve: The mitral valve is normal in structure. Trace mitral valve   regurgitation is seen.  Tricuspid Valve: The tricuspid valve is normal. Mild tricuspid   regurgitation is visualized. The tricuspid regurgitant velocity is 2.37   m/s, and with an assumed right atrial pressure of 5 mmHg, the estimated   right ventricular systolic pressure is normal at 27.4 mmHg.  Aortic Valve: The aortic valve is normal. Mild aortic valve sclerosis is   present, withno evidence of aortic valve stenosis. No evidence of aortic   valve regurgitation is seen.  Pulmonic Valve: The pulmonic valve is normal.    Crescent Mills MD  Electronically signed by Harvey MD  Signature Date/Time: 08/07/2013/7:04:59 AM  *** Final ***    IMPRESSION: .        Verified By: Yolonda Kida, M.D., MD    28-Apr-15 06:57, ECG  Ventricular Rate 56  Atrial Rate 56  P-R  Interval 206  QRS Duration 92  QT 470  QTc 453  P Axis 43  R Axis 27  T Axis -11  ECG interpretation   Sinus bradycardia  T wave abnormality, consider anterolateral ischemia  Abnormal ECG  When compared with ECG of 05-Aug-2013 23:53,  T wave inversion now evident in Anterior leads  Confirmed by Rockey Situ, TIMOTHY ([id number]) on 08/08/2013 7:52:06 AM    Overreader: Rockey Situ, TIMOTHY  ECG     28-Apr-15 12:52, ECG  Ventricular Rate 49  Atrial Rate 49  P-R Interval 200  QRS Duration 94  QT 494  QTc 446  P Axis 56  R Axis 62  T Axis -21  ECG interpretation   Sinus bradycardia  T wave abnormality, consider inferior ischemia  T wave abnormality, consider anterolateral ischemia  Abnormal ECG  When compared with ECG of 07-Aug-2013 06:57,  No significant change was found  ----------unconfirmed----------    Confirmed by OVERREAD, NOT ([id number]), editor PEARSON, BARBARA ([id number]) on 08/07/2013 3:31:36 PM  ECG    Assessment/Plan:  Assessment/Plan:  Assessment  IMP S/P NQMI CAD [place] S/P PCI stent to LAD DES Hyperlipidemia Borderline DM .   Plan PLAN asa 325 MG DAILY Plavix 75 mg po daily for1 yr B-blocker therpay Add low dose ACE as outpt for CAD Continue statin therapy , consider switching to Lipitor or Crestor No heavt lifting for 1-2 weeks Increase exercise daily in a few days F/U with cardiology 1 week.   Electronic Signatures: Lujean Amel D (MD)  (Signed 29-Apr-15 09:07)  Authored: Chief Complaint, VITAL SIGNS/ANCILLARY NOTES, Brief Assessment, Lab Results, Radiology Results, Assessment/Plan   Last Updated: 29-Apr-15 09:07 by Yolonda Kida (MD)

## 2014-08-03 NOTE — Consult Note (Signed)
Chief Complaint:  Subjective/Chief Complaint Pt is s/p cardiac cath and PCI today. He c/o of cp nausea but states that it is improving   VITAL SIGNS/ANCILLARY NOTES: **Vital Signs.:   28-Apr-15 17:00  Vital Signs Type Routine  Pulse Pulse 56  Respirations Respirations 10  Systolic BP Systolic BP 063  Diastolic BP (mmHg) Diastolic BP (mmHg) 52  Mean BP 72  Pulse Ox % Pulse Ox % 93  Pulse Ox Activity Level  At rest  Oxygen Delivery 2L  Pulse Ox Heart Rate 56  *Intake and Output.:   28-Apr-15 18:00  Grand Totals Intake:  222.4 Output:      Net:  222.4 24 Hr.:  1227.3  Oral Intake      In:  120  Percentage of Meal Eaten  75  Nitroglycerin      In:  2.4  IV (Primary)      In:  0  IV (Primary)      In:  100   Brief Assessment:  GEN well developed, well nourished, no acute distress   Cardiac Regular  no murmur  -- LE edema  -- JVD   Respiratory normal resp effort  clear BS   Gastrointestinal Normal   Gastrointestinal details normal Soft  Nontender  Nondistended  Bowel sounds normal   EXTR negative cyanosis/clubbing, negative edema   Additional Physical Exam Right groin is ok no issues no swelling or bleeding   Lab Results: LabObservation:  27-Apr-15 14:16   OBSERVATION Reason for Test  Cardiology:  27-Apr-15 14:16   Echo Doppler REASON FOR EXAM:     COMMENTS:     PROCEDURE: [place] - ECHO DOPPLER COMPLETE(TRANSTHOR)  - Aug 06 2013  2:16PM   RESULT: Echocardiogram Report  Patient Name:   Joseph Mann Date of Exam: 08/06/2013 Medical Rec #:  [id number]         Custom1: Date of Birth:  02/19/1941      Height:       76.0 in Patient Age:    74 years       Weight:       215.0 lb Patient Gender: M              BSA:          2.28 m??  Indications: MI Sonographer:    Sherrie Sport RDCS Referring Phys: Valentino Nose, K  Summary:  1. Left ventricular ejection fraction, by visual estimation, is 70 to  75%.  2. Normal global left ventricular systolic function.  3. Mildly  increased left ventricular septal thickness.  4. Mild aortic valve sclerosis without stenosis.  5. Mildly increased left ventricular posterior wall thickness.  6. Mild tricuspid regurgitation. 2D AND M-MODE MEASUREMENTS (normal ranges within parentheses): Left Ventricle:          Normal IVSd (2D):      1.58 cm (0.7-1.1) LVPWd (2D):     1.18 cm (0.7-1.1) Aorta/LA:                  Normal LVIDd (2D):     4.54 cm (3.4-5.7) Aortic Root (2D): 3.40 cm (2.4-3.7) LVIDs (2D):     2.54 cm           Left Atrium (2D): 5.00 cm (1.9-4.0) LV FS (2D):     44.1 %   (>25%) LV EF (2D):     75.4 %   (>50%)  Right Ventricle:                                   RVd (2D):        3.71 cm LV DIASTOLIC FUNCTION: MV Peak E: 0.55 m/s E/e' Ratio: 12.00 MV Peak A: 0.88 m/s Decel Time: 285 msec E/A Ratio: 0.62 SPECTRAL DOPPLER ANALYSIS (where applicable): Mitral Valve: MV P1/2 Time: 82.65 msec MV Area, PHT: 2.66 cm?? Aortic Valve: AoV Max Vel: 1.03 m/s AoV Peak PG: 4.3 mmHg AoV Mean PG: LVOT Vmax: 1.11 m/s LVOT VTI:  LVOT Diameter: 2.10 cm AoV Area, Vmax: 3.72 cm?? AoV Area, VTI:  AoV Area, Vmn: Tricuspid Valve and PA/RV Systolic Pressure: TR Max Velocity: 2.37 m/s RA  Pressure: 5 mmHg RVSP/PASP: 27.4 mmHg Pulmonic Valve: PV Max Velocity: 1.54 m/s PV Max PG: 9.4 mmHg PV Mean PG:  PHYSICIAN INTERPRETATION: Left Ventricle: The left ventricular internal cavity size was normal. LV  septal wall thickness was mildly increased. LV posterior wall thickness  was mildly increased. Global LV systolic function was normal. Left  ventricular ejection fraction, by visual estimation, is 70 to 75%. Right Ventricle: The right ventricular size is normal. Global RV systolic  function is normal. Left Atrium: The left atrium is normal in size. Right Atrium: The right atrium is normal in size. Pericardium: There is no evidence of pericardial effusion. Mitral Valve: The mitral valve is normal in  structure. Trace mitral valve  regurgitation is seen. Tricuspid Valve: The tricuspid valve is normal. Mild tricuspid  regurgitation is visualized. The tricuspid regurgitant velocity is 2.37  m/s, and with an assumed right atrial pressure of 5 mmHg, the estimated  right ventricular systolic pressure is normal at 27.4 mmHg. Aortic Valve: The aortic valve is normal. Mild aortic valve sclerosis is  present, withno evidence of aortic valve stenosis. No evidence of aortic  valve regurgitation is seen. Pulmonic Valve: The pulmonic valve is normal.  [place] MD Electronically signed by [place] MD Signature Date/Time: 08/07/2013/7:04:59 AM *** Final ***  IMPRESSION: .    Verified By: Yolonda Kida, M.D., MD  Routine Chem:  27-Apr-15 12:32   Result Comment TROPONIN - RESULTS VERIFIED BY REPEAT TESTING.  - ELEVATED TROPONIN PREVIOUSLY CALLED ON  - 08-06-13 AT [email or web address]  Result(s) reported on 06 Aug 2013 at 01:33PM.  Cardiac:  27-Apr-15 12:32   CK, Total 103 (39-308 NOTE: NEW REFERENCE RANGE  05/14/2013)  CPK-MB, Serum 2.1 (Result(s) reported on 06 Aug 2013 at 01:18PM.)  Troponin I  0.30 (0.00-0.05 0.05 ng/mL or less: NEGATIVE  Repeat testing in 3-6 hrs  if clinically indicated. >0.05 ng/mL: POTENTIAL  MYOCARDIAL INJURY. Repeat  testing in 3-6 hrs if  clinically indicated. NOTE: An increase or decrease  of 30% or more on serial  testing suggests a  clinically important change)    20:53   CK, Total 92 (39-308 NOTE: NEW REFERENCE RANGE  05/14/2013)  CPK-MB, Serum 1.3 (Result(s) reported on 06 Aug 2013 at 09:38PM.)   Radiology Results: XRay:    27-Apr-15 00:30, Chest Portable Single View  Chest Portable Single View   REASON FOR EXAM:    CP  COMMENTS:       PROCEDURE: DXR - DXR PORTABLE CHEST SINGLE VIEW  - Aug 06 2013 12:30AM     CLINICAL DATA:  Chest pain.    EXAM:  PORTABLE CHEST - 1 VIEW    COMPARISON:  Chest radiograph performed  07/13/2012    FINDINGS:  The lungs are well-aerated and clear. There is no evidence of focal  opacification, pleural effusion or pneumothorax.  The cardiomediastinal silhouette is within normal limits. No acute  osseous abnormalities are seen. Chronic right-sided rib deformities  are seen.     IMPRESSION:  No acute cardiopulmonary process seen.      Electronically Signed    By: Garald Balding M.D.    On: 08/06/2013 00:48         Verified By: JEFFREY . Radene Knee, M.D.,  Cardiology:    26-Apr-15 23:53, ED ECG  Ventricular Rate 63  Atrial Rate 63  P-R Interval 196  QRS Duration 88  QT 392  QTc 401  P Axis 40  R Axis 14  T Axis 1  ECG interpretation   Normal sinus rhythm  Nonspecific T wave abnormality  Abnormal ECG  When compared with ECG of 13-Jul-2012 09:07,  T wave inversion now evident in Inferior leads  Nonspecific T wave abnormality now evident in Anterior leads  ----------unconfirmed----------  Confirmed by OVERREAD, NOT ([id number]), editor PEARSON, BARBARA ([id number]) on 08/06/2013 3:25:59 PM  ED ECG     27-Apr-15 14:16, Echo Doppler  Echo Doppler   REASON FOR EXAM:      COMMENTS:       PROCEDURE: [place] - ECHO DOPPLER COMPLETE(TRANSTHOR)  - Aug 06 2013  2:16PM     RESULT: Echocardiogram Report    Patient Name:   Joseph Mann Date of Exam: 08/06/2013  Medical Rec #:  [id number]         Custom1:  Date of Birth:  Sep 29, 1940      Height:       76.0 in  Patient Age:    74 years       Weight:       215.0 lb  Patient Gender: M              BSA:          2.28 m??    Indications: MI  Sonographer:    Sherrie Sport RDCS  Referring Phys: Valentino Nose, K    Summary:   1. Left ventricular ejection fraction, by visual estimation, is 70 to   75%.   2. Normal global left ventricular systolic function.   3. Mildly increased left ventricular septal thickness.   4. Mild aortic valve sclerosis without stenosis.   5. Mildly increased left ventricular posterior wall thickness.   6. Mild  tricuspid regurgitation.  2D AND M-MODE MEASUREMENTS (normal ranges within parentheses):  Left Ventricle:          Normal  IVSd (2D):      1.58 cm (0.7-1.1)  LVPWd (2D):     1.18 cm (0.7-1.1) Aorta/LA:                  Normal  LVIDd (2D):     4.54 cm (3.4-5.7) Aortic Root (2D): 3.40 cm (2.4-3.7)  LVIDs (2D):     2.54 cm           Left Atrium (2D): 5.00 cm (1.9-4.0)  LV FS (2D):     44.1 %   (>25%)  LV EF (2D):     75.4 %   (>50%)                                    Right Ventricle:  RVd (2D):        3.71 cm  LV DIASTOLIC FUNCTION:  MV Peak E: 0.55 m/s E/e' Ratio: 12.00  MV Peak A: 0.88 m/s Decel Time: 285 msec  E/A Ratio: 0.62  SPECTRAL DOPPLER ANALYSIS (where applicable):  Mitral Valve:  MV P1/2 Time: 82.65 msec  MV Area, PHT: 2.66 cm??  Aortic Valve: AoV Max Vel: 1.03 m/s AoV Peak PG: 4.3 mmHg AoV Mean PG:  LVOT Vmax: 1.11 m/s LVOT VTI:  LVOT Diameter: 2.10 cm  AoV Area, Vmax: 3.72 cm?? AoV Area, VTI:  AoV Area, Vmn:  Tricuspid Valve and PA/RV Systolic Pressure: TR Max Velocity: 2.37 m/s RA   Pressure: 5 mmHg RVSP/PASP: 27.4 mmHg  Pulmonic Valve:  PV Max Velocity: 1.54 m/s PV Max PG: 9.4 mmHg PV Mean PG:    PHYSICIAN INTERPRETATION:  Left Ventricle: The left ventricular internal cavity size was normal. LV   septal wall thickness was mildly increased. LV posterior wall thickness   was mildly increased. Global LV systolic function was normal. Left   ventricular ejection fraction, by visual estimation, is 70 to 75%.  Right Ventricle: The right ventricular size is normal. Global RV systolic   function is normal.  Left Atrium: The left atrium is normal in size.  Right Atrium: The right atrium is normal in size.  Pericardium: There is no evidence of pericardial effusion.  Mitral Valve: The mitral valve is normal in structure. Trace mitral valve   regurgitation is seen.  Tricuspid Valve: The tricuspid valve is normal. Mild tricuspid   regurgitation  is visualized. The tricuspid regurgitant velocity is 2.37   m/s, and with an assumed right atrial pressure of 5 mmHg, the estimated   right ventricular systolic pressure is normal at 27.4 mmHg.  Aortic Valve: The aortic valve is normal. Mild aortic valve sclerosis is   present, withno evidence of aortic valve stenosis. No evidence of aortic   valve regurgitation is seen.  Pulmonic Valve: The pulmonic valve is normal.    Ramirez-Perez MD  Electronically signed by [place] MD  Signature Date/Time: 08/07/2013/7:04:59 AM  *** Final ***    IMPRESSION: .        Verified By: Yolonda Kida, M.D., MD    28-Apr-15 12:52, ECG  Ventricular Rate 49  Atrial Rate 49  P-R Interval 200  QRS Duration 94  QT 494  QTc 446  P Axis 56  R Axis 62  T Axis -21  ECG interpretation   Sinus bradycardia  T wave abnormality, consider inferior ischemia  T wave abnormality, consider anterolateral ischemia  Abnormal ECG  When compared with ECG of 07-Aug-2013 06:57,  No significant change was found  ----------unconfirmed----------    Confirmed by OVERREAD, NOT ([id number]), editor PEARSON, BARBARA ([id number]) on 08/07/2013 3:31:36 PM  ECG    Assessment/Plan:  Assessment/Plan:  Assessment IMP [place] NQMI CAD HTN Hyperlipidemia BPH Borderline DM .,   Plan PLAN ASA 325  mgpo daily Plavix 75 mg po daily x 1 yr B-blockers Low dose ACE Rec cardiac rehab Hopefully d/c home in the am Statin therapy Reduce activity for about 1-2 weeks F/U cardiology as outpt 1-2 weeks   Electronic Signatures: Lujean Amel D (MD)  (Signed 28-Apr-15 20:59)  Authored: Chief Complaint, VITAL SIGNS/ANCILLARY NOTES, Brief Assessment, Lab Results, Radiology Results, Assessment/Plan   Last Updated: 28-Apr-15 20:59 by Yolonda Kida (MD)

## 2014-08-03 NOTE — Discharge Summary (Signed)
PATIENT NAME:  Joseph Mann, Joseph Mann MR#:  [id number] DATE OF BIRTH:  12/14/1940  DATE OF ADMISSION:  08/06/2013 DATE OF PLANNED DISCHARGE:  08/08/2013   DISCHARGE DIAGNOSIS: Non-ST-elevated myocardial infarction.   DISCHARGE MEDICATIONS: Per [facility] med reconciliation. Briefly, will be on his usual home medications with the addition of Plavix 75 mg daily and metoprolol XL 25 mg once a day.   HISTORY AND PHYSICAL: Please see detailed history and physical done on admission.   HOSPITAL COURSE: The patient was admitted with chest pain, stuttering. He did rule in for MI by cardiac enzymes, including initial troponin of 0.14, and it increased to 0.28, though his CKs were normal. His troponin had a height of 0.31 before then decreasing. He continued to have stuttering chest pain during the hospitalization until he had his cardiac catheterization, which showed a 99% left anterior descending lesion, which was stented by Dr. Clayborn Bigness successfully. He will restart his metformin at home as his renal function does appear to be unchanged post cardiac catheterization substantially. He will be seen both by myself and Dr. Clayborn Bigness next week in the office. He knows to call with any further difficulties in the meantime.   TIME SPENT: Note, it took approximately 34 minutes to do all discharge tasks today for Mr. Joiner.   ____________________________ Ocie Cornfield. Ouida Sills, MD mwa:lb D: 08/08/2013 07:35:24 ET T: 08/08/2013 07:54:10 ET JOB#: [id number]  cc: Ocie Cornfield. Ouida Sills, MD, <Dictator> Kirk Ruths MD ELECTRONICALLY SIGNED 08/09/2013 8:05

## 2014-08-03 NOTE — Consult Note (Signed)
PATIENT NAME:  Joseph Mann, Joseph Mann MR#:  [id number] DATE OF BIRTH:  1941/03/25  DATE OF CONSULTATION:  08/06/2013  REFERRING PHYSICIAN:   CONSULTING PHYSICIAN:  Dwayne D. Clayborn Bigness, MD  PRIMARY CARE PHYSICIAN: Dr. Ouida Sills  INDICATION FOR ADMISSION:  Unstable angina.  HISTORY OF PRESENT ILLNESS: The patient is a 74 year old white male with a history of hypertension, pre diabetes, hyperlipidemia, and chest pain symptoms. Describes 2 days duration, intermittent chest pain, and anginal symptoms. The patient denies diaphoresis, palpitations, orthopnea and lower extremity edema. These episodes all occurred at rest. The patient had 3 or 4 episodes off and on, midsternal, 10/10 at times, with radiation to the left arm with occasional shortness of breath. He did notice some discomfort with activity. Also with walking he got more short of breath and chest burning and this is over the last several days to weeks as well so he finally was admitted and came for further evaluation.   REVIEW OF SYSTEMS: No blackout spells or syncope. No nausea, vomiting. Denies fever, chills, sweats. No weight loss or weight gain. No hemoptysis or hematemesis. No bright red blood per rectum. No vision change or hearing change. No sputum production or cough.   PAST MEDICAL HISTORY: Pre diabetes, hyperlipidemia, hypertension, BPH.  SOCIAL HISTORY: Occasional alcohol use. No smoking.   FAMILY HISTORY: Positive for coronary artery disease.   ALLERGIES: AMOXICILLIN AND CONTRAST DYE.  MEDICATIONS: Doxazosin 8 mg at bedtime, Norco 325/5 mg p.o. every 6 hours, Prilosec 20 mg at bedtime, Zyrtec 10 mg a day.   PHYSICAL EXAMINATION: VITAL SIGNS: Blood pressure 145/80, pulse 55, respiratory rate 14.  HEENT: Normocephalic, atraumatic. Pupils reactive to light.  NECK: Supple. No significant JVD, bruits, or adenopathy.  LUNGS: Clear to auscultation and percussion. No significant wheeze, rhonchi, or rale.  HEART: Regular rate and rhythm.  Positive bowel sounds. No rebound, guarding or tenderness.  EXTREMITIES: Within normal limits. No cyanosis, clubbing, or edema.  NEUROLOGIC: Intact.  SKIN: Normal.   DIAGNOSTIC DATA: EKG: Sinus bradycardia with nonspecific ST-T changes, low voltage.  Sodium 142, potassium 3.7, chloride 107, bicarb 30, BUN 13, creatinine 0.88, glucose 165. LFTs normal. Troponin 0.14. White count 4, hemoglobin 12.8, platelet count 149,000. PTT 37.5. INR 1.   ASSESSMENT: 1.  Non-Q-wave myocardial infarction.  2.  Elevated troponins. 3.  Unstable angina.  4.  Hyperglycemia.  5.  Hyperlipidemia.  6.  Gastroesophageal reflux disease.   PLAN:  1.  Continue current medications. Continue telemetry. Continue aspirin therapy. Agree with statin therapy. Follow up cardiac enzymes. Follow-up EKG. Agree with echocardiogram. Would recommend further evaluation, probably with a cardiac catheterization to definitively evaluate for significant coronary artery disease, unstable angina.  2.  Continue hyperlipidemia management.  3.  Follow up glucose therapy. 4.  Deep vein thrombosis prophylaxis.  5.  Zyrtec for sinus trouble.  6.  For benign prostatic hypertrophy, continue doxazosin. 7.  Base further evaluation on the results of cardiac catheterization.  ____________________________ Loran Senters Clayborn Bigness, MD ddc:sb D: 08/06/2013 14:52:09 ET T: 08/06/2013 15:22:39 ET JOB#: [id number]  cc: Dwayne D. Clayborn Bigness, MD, <Dictator> Yolonda Kida MD ELECTRONICALLY SIGNED 09/05/2013 16:24

## 2014-08-11 NOTE — Discharge Summary (Signed)
PATIENT NAME:  Joseph Joseph Mann, Joseph Mann MR#:  [id number] DATE OF BIRTH:  20-Apr-1940  DATE OF ADMISSION:  06/19/2014 DATE OF DISCHARGE:  06/22/2014 ADMITTING DIAGNOSIS: Degenerative arthrosis of the left knee.   DISCHARGE DIAGNOSIS: Degenerative arthrosis of the right knee.   HISTORY OF PRESENT ILLNESS: The patient is a 74 year old gentleman who has been followed at [facility] for progression of left knee pain. The patient had reported a many year history of progressive knee pain. The pain was localized along the medial aspect of the knee and was noted be aggravated with weight-bearing activities.  He had also reported some start-up stiffness and occasionally had some near giving way of the knee. Meloxicam provided only modest relief.  At the time of surgery, he was not using any ambulatory aid. The patient states that the pain had increased to the point that it was significantly interfering with his activities of daily living. X-rays taken in [place] showed narrowing of the medial cartilage space with bone-on-bone articulation being noted as well as being associated with varus alignment. Osteophyte and subchondral sclerosis were noted. After discussion of the risks and benefits of surgical intervention, the patient expressed his understanding of the risks and benefits and agreed for plans for surgical intervention.   PROCEDURE: Left total knee arthroplasty using computer-assisted navigation.   ANESTHESIA: Spinal.   SOFT TISSUE RELEASE: Anterior cruciate ligament, posterior cruciate ligament, deep and superficial medial collateral ligaments, as well as patellofemoral ligament.   IMPLANTS UTILIZED:  DePuy PFC Sigma size 6 posterior stabilized femoral component (cemented), size 6 MBT tibial component (cemented), 41 mm 3-pegged oval dome patella (cemented), and a 10 mm stabilized rotating platform polyethylene insert. Gentamicin bone cement was utilized due to the patient's history of  diabetes.   HOSPITAL COURSE: The patient tolerated the procedure very well. He had no complications. He was then taken to the PACU where he was stabilized and then transferred to the orthopedic floor. The patient began receiving anticoagulation therapy of Lovenox 30 mg subcutaneous every 12 hours per anesthesia and pharmacy protocol.  He was fitted with TED stockings bilaterally. These were allowed to be removed 1 hour per 8 hour shift. The TED stocking was applied to the left leg  day 2 after removal of the Hemovac and dressing change.  The patient was also fitted with AVI compression foot pumps bilaterally set at 80 mmHg.  His calves have been nontender. There has been no evidence of any DVTs. Negative Homan's sign. Heels were elevated off the bed using rolled towels.   The patient denied chest pain or shortness of breath. Vital signs have been stable. He has been afebrile. Hemodynamically, he is stable. No transfusions were given other than the AutoVac transfusion given the first 6 hours postoperatively.   Physical therapy was initiated on day 1 for gait training and transfers. He did exceptionally well. The first day he had 90 degrees of flexion and was ambulating approximately 100 feet. However, that night he was noted to have severe pain and had difficulty getting this under control. However, this was finally controlled and the patient was resting comfortably on day 2. Occupational therapy was also initiated on day 1 for ADL and assistive devices.   The patient's IV, Foley and Hemovac were discontinued on day 2 along with a dressing change. The wound was free of any drainage or signs of infection. Polar Care was reapplied to the surgical leg, maintaining a temperature of 40 to 50 degrees Fahrenheit.  DISPOSITION: The patient is being discharged to home in improved stable condition. He may continue to weight bear as tolerated. Continue using a rolling walker until cleared by physical therapy to go to  a quad cane.  He will receive home health physical therapy.  Continue to elevate the lower extremity. Continue TED stockings bilaterally. These are to be worn during the day but may be removed at night. Elevate heels off the bed. Encourage incentive spirometry every 1 hour while awake.  Encourage cough, deep breathing every 2 hours while awake.  He is placed on an ADA diet. Continue Polar Care around-the-clock, maintaining a temperature of 40 to 50 degrees Fahrenheit. He was instructed in wound care. He is not to get the dressing wet until the staples are removed in 2 weeks. He has a follow-up appointment with [facility] on 07/04/2014 at 8:45, sooner if any temperatures of 101.5 or greater or excessive bleeding.   The patient may resume his regular medications that he was on prior to admission. He was given a prescription for Roxicodone 5 to 10 mg every 4 to 6 hours p.r.n. for pain, tramadol 50 to 100 mg every 4 to 6 hours p.r.n. for pain and OxyContin 20 mg every 12 hours.  He was given a prescription for Lovenox 40 mg subcutaneously daily for 14 days, then discontinue and begin taking one 81 mg enteric-coated aspirin.   PAST MEDICAL HISTORY: Hypertension, diabetes, BPH, migraines, hyperlipidemia.     ____________________________ Vance Peper, PA jrw:DT D: 06/21/2014 07:29:00 ET T: 06/21/2014 13:37:38 ET JOB#: [id number]  cc: Vance Peper, PA, <Dictator> Joseph Meara PA ELECTRONICALLY SIGNED 07/02/2014 21:07

## 2014-08-11 NOTE — Discharge Summary (Signed)
PATIENT NAME:  Joseph Mann, Joseph Mann MR#:  [id number] DATE OF BIRTH:  1941/01/26  DATE OF ADMISSION:  06/19/2014 DATE OF DISCHARGE:  06/24/2014  ADDENDUM   On June 22, 2014, the patient developed urinary retention and was having poor progress with physical therapy. Unfortunately, he was unable to go home at this time. A Foley catheter had been placed and he was bladder trained. On the 13th of March, 2016, postop day 4, the patient continued to make slow progress with physical therapy and physical therapy had recommended the patient go to rehab for continuation of therapy and occupational therapy. He continued to retain urine and an in and out cath was performed. The patient was started on Flomax. Urinalysis was checked and was negative for any signs of infection. On the night of the 13th, the patient began to urinate and he had urinated several times on his own. On June 24, 2014, the patient was not having any issues with urinary retention. Due to slow progress with physical therapy, it is recommended he go to rehab for continuation of rehab.   CONDITION AT DISCHARGE: Stable.   NEW DISCHARGE INSTRUCTIONS: The patient may gradually increase weight-bearing on the effected extremity. Elevate the effected foot or leg on 2 pillows with the foot higher than the knee. Thigh-high TED hose on both legs and remove at bedtime, replace on arising the next morning. Elevate the heels off the bed. Use incentive spirometer every hour while awake and encourage cough and deep breathing. The patient may continue using Polar Care unit maintaining temp between 40 and 50 degrees. Do not get the dressing or bandage wet or dirty. Call [facility] orthopedics if the dressing gets water under it. Leave the dressing on. Call [facility] orthopedics if any of the following occur: Bright red bleeding from the incision wound, fever above 101.5 degrees, redness or swelling at the incision. Call [facility] orthopedics if you  experience any increased leg pain, numbness or weakness in your legs or bowel or bladder symptoms. The patient will go to rehab facility for physical therapy.   DISCHARGE MEDICATIONS: Please see list of discharge medications from discharge instructions.   ____________________________ T. Rachelle Hora, PA-C tcg:sb D: 06/24/2014 07:21:32 ET T: 06/24/2014 07:54:16 ET JOB#: [id number]  cc: T. Rachelle Hora, PA-C, <Dictator> Duanne Guess [place] ELECTRONICALLY SIGNED 07/03/2014 15:18

## 2014-08-11 NOTE — Op Note (Signed)
PATIENT NAME:  Joseph Mann, Joseph Mann MR#:  [id number] DATE OF BIRTH:  February 10, 1941  DATE OF PROCEDURE:  06/19/2014  PREOPERATIVE DIAGNOSIS: Degenerative arthrosis of the left knee.   POSTOPERATIVE DIAGNOSIS: Degenerative arthrosis of the left knee.   PROCEDURE PERFORMED: Left total knee arthroplasty using computer-assisted navigation.   SURGEON: Laurice Record. Holley Bouche., MD  ASSISTANT: Vance Peper, PA (required to maintain retraction throughout the procedure).   ANESTHESIA: Spinal.   ESTIMATED BLOOD LOSS: 50 mL.   FLUIDS REPLACED: Crystalloid 900 mL.   TOURNIQUET TIME: 92 minutes.   DRAINS: Two medium drains to reinfusion system.   SOFT TISSUE RELEASES: Anterior cruciate ligament, posterior cruciate ligament, deep and superficial medial collateral ligament, and patellofemoral ligament.   IMPLANTS UTILIZED: DePuy PFC Sigma size 6 posterior stabilized femoral component (cemented), size 6 MBT tibial component (cemented), 41 mm 3-peg oval dome patella (cemented), and a 10 mm stabilized rotating platform polyethylene insert. Gentamicin bone cement was utilized due to the patient's history of diabetes.   INDICATIONS FOR SURGERY: The patient is a 74 year old male who has been seen for complaints of persistent and progressive left knee pain. X-rays demonstrated severe degenerative changes in a tricompartmental fashion with relative varus deformity. After discussion of the risks and benefits of surgical intervention, the patient expressed understanding of the risks and benefits, and agreed with plans for surgical intervention.   PROCEDURE IN DETAIL: The patient was brought into the operating room and, after adequate spinal anesthesia was achieved, a tourniquet was placed on the patient's upper left thigh. The patient's left knee and leg were cleaned and prepped with alcohol and DuraPrep and draped in the usual sterile fashion. A "timeout" was performed as per usual protocol. The left lower extremity was  exsanguinated using an Esmarch, and the tourniquet was inflated to 300 mmHg. An anterior longitudinal incision was made followed by a standard mid vastus approach. A moderate effusion was evacuated. The deep fibers of the medial collateral ligament were elevated in a subperiosteal fashion off the medial flare of the tibia so as to maintain a continuous soft tissue sleeve. The patella was subluxed laterally and the patellofemoral ligament was incised. Inspection of the knee demonstrated severe degenerative changes with full-thickness loss of articular cartilage to the medial compartment. Prominent osteophytes were debrided using a rongeur. Anterior and posterior cruciate ligaments were excised. Two 4.0 mm Schanz pins were inserted into the femur and into the tibia for attachment of the array of trackers used for computer-assisted navigation. Hip center was identified using circumduction technique. Distal landmarks were mapped using the computer. The distal femur and proximal tibia were mapped using the computer. Distal femoral cutting guide and was positioned using computer-assisted navigation so as to achieve a 5 degree distal valgus cut. Cut was performed and verified using the computer. Distal femur was sized and it was felt that a size 6 femoral component was appropriate. A size 6 cutting guide was positioned and the anterior cut was performed and verified using the computer. This was followed by completion of the posterior and chamfer cuts. Femoral cutting guide for the central box was then positioned and the central box cut was performed. Attention was then directed to the proximal tibia. Medial and lateral menisci were excised. The extramedullary tibial cutting guide was positioned using computer-assisted navigation so as to achieve a 0 degree varus valgus alignment and 0 degree posterior slope. Cut was performed and verified using the computer. The proximal tibia was sized and it was felt that a  size 6 tibial  tray was appropriate. Tibial and femoral trials were inserted followed by insertion of a 10 mm polyethylene trial. The knee was felt to be tight medially. A Cobb elevator was used to elevate the superficial fibers of the medial collateral ligament. This allowed for excellent mediolateral soft tissue balancing both in full extension and in flexion. Finally, the patella was cut and prepared so as to accommodate a 41 mm 3-peg oval dome patella. Patellar trial was placed and the knee was placed through a range of motion with excellent patellar tracking appreciated.   Femoral component was removed after debridement of posterior osteophytes. A central post hole for the tibial component was reamed followed by insertion of a keel punch. Tibial trials were then removed. The cut surfaces of bone were irrigated with copious amounts of normal saline with antibiotic solution using pulsatile lavage and then suctioned dry. Polymethyl methacrylate cement with gentamicin was prepared in the usual fashion using a vacuum mixer. Cement was applied to the cut surface of the proximal tibia as well as along the undersurface of size 6 MBT tibial component. The tibial component was positioned and impacted into place. Excess cement was removed using [employer] fast streamer. Cement was then applied to the cut surface of the femur as well as on the posterior flanges of a size 6 posterior stabilized femoral component. Femoral component was positioned and impacted into place. Excess cement was removed using [employer] fast streamer. A 10 mm stabilized polyethylene trial was inserted and the knee was brought in full extension with steady axial compression applied. Finally, cement was applied to the backside of a 41 mm 3-peg oval dome patella and the patella component was positioned and patellar clamp applied. Excess cement was removed using [employer] fast streamer.   After adequate curing of cement, the tourniquet was deflated after total tourniquet time of 92  minutes. Hemostasis was achieved using electrocautery. The knee was irrigated with copious amounts of normal saline with antibiotic solution using pulsatile lavage and then suctioned dry; 20 mL of 1.3% Exparel in 40 mL of normal saline was then injected along the posterior capsule, medial and lateral recesses, and along the arthrotomy site. A 10 mm stabilized rotating platform polyethylene insert was inserted and the knee was placed through a range of motion with excellent mediolateral soft tissue balancing appreciated and excellent patellar tracking appreciated. Two medium drains were placed in the wound bed and brought out through a separate stab incision to be attached to a reinfusion system. The medial parapatellar portion of the incision was reapproximated using interrupted sutures of #1 Vicryl. The subcutaneous tissue was injected with 0.25% Marcaine with epinephrine. The subcutaneous tissue was then reapproximated in layers using first #0 Vicryl followed by 2-0 Vicryl. Skin was closed with skin staples. A sterile dressing was applied.   The patient tolerated the procedure well. He was transported to the recovery room in stable condition.    ____________________________ Laurice Record. Holley Bouche., MD jph:bm D: 06/19/2014 20:24:00 ET T: 06/20/2014 03:33:55 ET JOB#: [id number]  cc: Laurice Record. Holley Bouche., MD, <Dictator> Laurice Record Holley Bouche MD ELECTRONICALLY SIGNED 06/29/2014 16:49

## 2014-10-16 ENCOUNTER — Ambulatory Visit: Payer: Federal, State, Local not specified - PPO | Admitting: Internal Medicine

## 2015-02-10 ENCOUNTER — Emergency Department
Admission: EM | Admit: 2015-02-10 | Discharge: 2015-02-10 | Disposition: A | Discharge status: Home / Self Care | Payer: Federal, State, Local not specified - PPO | Attending: Emergency Medicine | Admitting: Emergency Medicine

## 2015-02-10 ENCOUNTER — Encounter: Payer: Self-pay | Admitting: Emergency Medicine

## 2015-02-10 DIAGNOSIS — E119 Type 2 diabetes mellitus without complications: Secondary | ICD-10-CM | POA: Diagnosis not present

## 2015-02-10 DIAGNOSIS — Z7902 Long term (current) use of antithrombotics/antiplatelets: Secondary | ICD-10-CM | POA: Diagnosis not present

## 2015-02-10 DIAGNOSIS — R040 Epistaxis: Secondary | ICD-10-CM

## 2015-02-10 DIAGNOSIS — Z7982 Long term (current) use of aspirin: Secondary | ICD-10-CM | POA: Insufficient documentation

## 2015-02-10 DIAGNOSIS — I10 Essential (primary) hypertension: Secondary | ICD-10-CM | POA: Diagnosis not present

## 2015-02-10 DIAGNOSIS — Z792 Long term (current) use of antibiotics: Secondary | ICD-10-CM | POA: Insufficient documentation

## 2015-02-10 DIAGNOSIS — Z79899 Other long term (current) drug therapy: Secondary | ICD-10-CM | POA: Insufficient documentation

## 2015-02-10 DIAGNOSIS — Z880 Allergy status to penicillin: Secondary | ICD-10-CM | POA: Diagnosis not present

## 2015-02-10 HISTORY — DX: Type 2 diabetes mellitus without complications: E11.9

## 2015-02-10 HISTORY — DX: Acute myocardial infarction, unspecified: I21.9

## 2015-02-10 HISTORY — DX: Essential (primary) hypertension: I10

## 2015-02-10 LAB — CBC
HCT: 36.5 % — ABNORMAL LOW (ref 40.0–52.0)
Hemoglobin: 12.5 g/dL — ABNORMAL LOW (ref 13.0–18.0)
MCH: 33 pg (ref 26.0–34.0)
MCHC: 34.3 g/dL (ref 32.0–36.0)
MCV: 96 fL (ref 80.0–100.0)
Platelets: 189 10*3/uL (ref 150–440)
RBC: 3.8 MIL/uL — AB (ref 4.40–5.90)
RDW: 13.5 % (ref 11.5–14.5)
WBC: 5.1 10*3/uL (ref 3.8–10.6)

## 2015-02-10 LAB — BASIC METABOLIC PANEL
ANION GAP: 8 (ref 5–15)
BUN: 18 mg/dL (ref 6–20)
CALCIUM: 8.8 mg/dL — AB (ref 8.9–10.3)
CO2: 25 mmol/L (ref 22–32)
Chloride: 107 mmol/L (ref 101–111)
Creatinine, Ser: 0.84 mg/dL (ref 0.61–1.24)
GFR calc Af Amer: >60 mL/min (ref >60)
GFR calc non Af Amer: >60 mL/min (ref >60)
Glucose, Bld: 216 mg/dL — ABNORMAL HIGH (ref 65–99)
POTASSIUM: 4.3 mmol/L (ref 3.5–5.1)
Sodium: 140 mmol/L (ref 135–145)

## 2015-02-10 LAB — GLUCOSE, CAPILLARY: Glucose-Capillary: 210 mg/dL — ABNORMAL HIGH (ref 65–99)

## 2015-02-10 MED ORDER — IBUPROFEN 600 MG PO TABS
600.0000 mg | ORAL_TABLET | Freq: Once | ORAL | Status: AC
  Administered 2015-02-10: 600 mg via ORAL
  Filled 2015-02-10: qty 1

## 2015-02-10 MED ORDER — ONDANSETRON HCL 4 MG/2ML IJ SOLN
INTRAMUSCULAR | Status: AC
  Administered 2015-02-10: 4 mg via INTRAVENOUS
  Filled 2015-02-10: qty 2

## 2015-02-10 MED ORDER — CLINDAMYCIN HCL 300 MG PO CAPS: 300.0000 mg | ORAL_CAPSULE | Freq: Three times a day (TID) | ORAL | Status: DC

## 2015-02-10 MED ORDER — HYDROCODONE-ACETAMINOPHEN 5-325 MG PO TABS: 1.0000 | ORAL_TABLET | ORAL | Status: DC | PRN

## 2015-02-10 MED ORDER — SODIUM CHLORIDE 0.9 % IV BOLUS (SEPSIS)
1000.0000 mL | Freq: Once | INTRAVENOUS | Status: AC
  Administered 2015-02-10: 1000 mL via INTRAVENOUS

## 2015-02-10 MED ORDER — HYDROCODONE-ACETAMINOPHEN 5-325 MG PO TABS
1.0000 | ORAL_TABLET | Freq: Once | ORAL | Status: AC
  Administered 2015-02-10: 1 via ORAL
  Filled 2015-02-10: qty 1

## 2015-02-10 MED ORDER — ONDANSETRON HCL 4 MG/2ML IJ SOLN
4.0000 mg | Freq: Once | INTRAMUSCULAR | Status: AC
  Administered 2015-02-10: 4 mg via INTRAVENOUS

## 2015-02-10 NOTE — Discharge Instructions (Signed)

## 2015-02-10 NOTE — ED Notes (Signed)
Pt presents to ED via EMS from personal home with c/o of epistaxis in left nostril. EMS states pt awoke with presenting sx this evening and noticed heavy bleeding coming out of left nostril. EMS states pt has a hx of HTN, DM, MI, and stent placement. EMS administered intranasal x2 afrin. Pt arrived alert and oriented x4.

## 2015-02-10 NOTE — ED Notes (Signed)
Bleeding stopped.  No acute distress noted.

## 2015-02-10 NOTE — ED Provider Notes (Signed)
[facility] Emergency Department Provider Note  ____________________________________________  Time seen: Approximately 732 AM  I have reviewed the triage vital signs and the nursing notes.   HISTORY  Chief Complaint Epistaxis    HPI Joseph Mann is a 74 y.o. male who comes in to the hospital today with epistaxis. The patient reports that at 4 AM he started bleeding out of his left nare. The patient reports that it has not stopped since the bleeding has started. The patient has been on Effient for the last 2 years. The patient reports that it does not matter how much he held his nose it would no stop bleeding. The patient never had this happen before. When EMS arrived his BP was 206/101. The patient reports that he was taken off of his blood pressure medicine 6 months ago. The patient reports that he was having dizziness at the time. The patient denies any chest pain, dizziness, SOB or any other symptoms.    Past Medical History  Diagnosis Date  . Hypertension   . Diabetes mellitus without complication ([place])   . MI (myocardial infarction) ([place])     There are no active problems to display for this patient.   Past Surgical History  Procedure Laterality Date  . Replacement total knee    . Back surgery    . Cholecystectomy    . Hernia repair      Current Outpatient Rx  Name  Route  Sig  Dispense  Refill  . aspirin EC 81 MG tablet   Oral   Take 81 mg by mouth daily.         Marland Kitchen loratadine (CLARITIN) 10 MG tablet   Oral   Take 10 mg by mouth daily.         . metFORMIN (GLUCOPHAGE-XR) 500 MG 24 hr tablet   Oral   Take 1,000 mg by mouth daily.         . prasugrel (EFFIENT) 10 MG TABS tablet   Oral   Take 10 mg by mouth daily.         . pravastatin (PRAVACHOL) 20 MG tablet   Oral   Take 20 mg by mouth at bedtime.         . clindamycin (CLEOCIN) 300 MG capsule   Oral   Take 1 capsule (300 mg total) by mouth 3 (three) times daily.    15 capsule   0   . HYDROcodone-acetaminophen (NORCO/VICODIN) 5-325 MG tablet   Oral   Take 1 tablet by mouth every 4 (four) hours as needed for moderate pain.   12 tablet   0     Allergies Amoxicillin and Plavix  No family history on file.  Social History Social History  Substance Use Topics  . Smoking status: Never Smoker   . Smokeless tobacco: None  . Alcohol Use: No    Review of Systems Constitutional: No fever/chills Eyes: No visual changes. ENT: No sore throat. Cardiovascular: Denies chest pain. Respiratory: Denies shortness of breath. Gastrointestinal: No abdominal pain.  No nausea, no vomiting.  No diarrhea.  No constipation. Genitourinary: Negative for dysuria. Musculoskeletal: Negative for back pain. Skin: Negative for rash. Neurological: Negative for headaches, focal weakness or numbness.  10-point ROS otherwise negative.  ____________________________________________   PHYSICAL EXAM:  VITAL SIGNS: ED Triage Vitals  Enc Vitals Group     BP 02/10/15 0535 154/88 mmHg     Pulse Rate 02/10/15 0535 64     Resp 02/10/15 0535  17     Temp 02/10/15 0535 98.1 F (36.7 C)     Temp Source 02/10/15 0535 Oral     SpO2 02/10/15 0535 98 %     Weight 02/10/15 0535 212 lb (96.163 kg)     Height 02/10/15 0535 6\' 2"  (1.88 m)     Head Cir --      Peak Flow --      Pain Score 02/10/15 0701 7     Pain Loc --      Pain Edu? --      Excl. in [place]? --     Constitutional: Alert and oriented. Well appearing and in no acute distress. Eyes: Conjunctivae are normal. PERRL. EOMI. Head: Atraumatic. Nose: No congestion/rhinnorhea. Mouth/Throat: Mucous membranes are moist.  Oropharynx non-erythematous. Cardiovascular: Normal rate, regular rhythm. Grossly normal heart sounds.  Good peripheral circulation. Respiratory: Normal respiratory effort.  No retractions. Lungs CTAB. Gastrointestinal: Soft and nontender. No distention. No abdominal bruits. No CVA  tenderness. Musculoskeletal: No lower extremity tenderness nor edema.  No joint effusions. Neurologic:  Normal speech and language. No gross focal neurologic deficits are appreciated. No gait instability. Skin:  Skin is warm, dry and intact. No rash noted. Psychiatric: Mood and affect are normal. Speech and behavior are normal.  ____________________________________________   LABS (all labs ordered are listed, but only abnormal results are displayed)  Labs Reviewed  CBC - Abnormal; Notable for the following:    RBC 3.80 (*)    Hemoglobin 12.5 (*)    HCT 36.5 (*)    All other components within normal limits  BASIC METABOLIC PANEL - Abnormal; Notable for the following:    Glucose, Bld 216 (*)    Calcium 8.8 (*)    All other components within normal limits  GLUCOSE, CAPILLARY - Abnormal; Notable for the following:    Glucose-Capillary 210 (*)    All other components within normal limits   ____________________________________________  EKG  none ____________________________________________  RADIOLOGY  none ____________________________________________   PROCEDURES  Procedure(s) performed: None  Critical Care performed: No  ____________________________________________   INITIAL IMPRESSION / ASSESSMENT AND PLAN / ED COURSE  Pertinent labs & imaging results that were available during my care of the patient were reviewed by me and considered in my medical decision making (see chart for details).  This is a 74 year old male who comes into the hospital today with epistaxis. When the patient arrived I went ahead and placed a nasal tampon with Murocel into his left knee there. The patient did have Afrin and I did inflate the tampon with Afrin as well. The patient did become a little dizzy and nauseous after the placement of the nasal tampon. I did give him a dose of Norco for pain as well as the Profen for headache. The patient did not have any significant bleeding after the  placement of the nasal tampon. The patient's blood pressure is improving while he is in the ED. He'll be discharged home to follow-up with ENT in 3-5 days. ____________________________________________   FINAL CLINICAL IMPRESSION(S) / ED DIAGNOSES  Final diagnoses:  Epistaxis      Loney Hering, MD 02/10/15 [phone]

## 2015-02-10 NOTE — ED Notes (Signed)
Nose clip placed on pt.

## 2015-02-10 NOTE — ED Notes (Signed)
MD Dahlia Client at bedside, completing medical evaluation.

## 2015-03-05 ENCOUNTER — Encounter: Payer: Self-pay | Admitting: *Deleted

## 2015-03-10 ENCOUNTER — Ambulatory Visit: Payer: Federal, State, Local not specified - PPO | Admitting: *Deleted

## 2015-03-10 ENCOUNTER — Ambulatory Visit
Admission: RE | Admit: 2015-03-10 | Discharge: 2015-03-10 | Disposition: A | Discharge status: Home / Self Care | Payer: Federal, State, Local not specified - PPO | Source: Ambulatory Visit | Attending: Unknown Physician Specialty | Admitting: Unknown Physician Specialty

## 2015-03-10 ENCOUNTER — Encounter: Payer: Self-pay | Admitting: *Deleted

## 2015-03-10 ENCOUNTER — Encounter
Admission: RE | Disposition: A | Discharge status: Home / Self Care | Payer: Self-pay | Source: Ambulatory Visit | Attending: Unknown Physician Specialty

## 2015-03-10 DIAGNOSIS — Z7984 Long term (current) use of oral hypoglycemic drugs: Secondary | ICD-10-CM | POA: Diagnosis not present

## 2015-03-10 DIAGNOSIS — E119 Type 2 diabetes mellitus without complications: Secondary | ICD-10-CM | POA: Insufficient documentation

## 2015-03-10 DIAGNOSIS — Z7982 Long term (current) use of aspirin: Secondary | ICD-10-CM | POA: Insufficient documentation

## 2015-03-10 DIAGNOSIS — I10 Essential (primary) hypertension: Secondary | ICD-10-CM | POA: Diagnosis not present

## 2015-03-10 DIAGNOSIS — D123 Benign neoplasm of transverse colon: Secondary | ICD-10-CM | POA: Insufficient documentation

## 2015-03-10 DIAGNOSIS — D125 Benign neoplasm of sigmoid colon: Secondary | ICD-10-CM | POA: Diagnosis not present

## 2015-03-10 DIAGNOSIS — K640 First degree hemorrhoids: Secondary | ICD-10-CM | POA: Diagnosis not present

## 2015-03-10 DIAGNOSIS — D124 Benign neoplasm of descending colon: Secondary | ICD-10-CM | POA: Diagnosis not present

## 2015-03-10 DIAGNOSIS — Z96659 Presence of unspecified artificial knee joint: Secondary | ICD-10-CM | POA: Diagnosis not present

## 2015-03-10 DIAGNOSIS — Z79899 Other long term (current) drug therapy: Secondary | ICD-10-CM | POA: Diagnosis not present

## 2015-03-10 DIAGNOSIS — Z9049 Acquired absence of other specified parts of digestive tract: Secondary | ICD-10-CM | POA: Insufficient documentation

## 2015-03-10 DIAGNOSIS — I252 Old myocardial infarction: Secondary | ICD-10-CM | POA: Diagnosis not present

## 2015-03-10 DIAGNOSIS — Z86010 Personal history of colonic polyps: Secondary | ICD-10-CM | POA: Diagnosis present

## 2015-03-10 HISTORY — PX: COLONOSCOPY: SHX5424

## 2015-03-10 LAB — GLUCOSE, CAPILLARY: GLUCOSE-CAPILLARY: 116 mg/dL — AB (ref 65–99)

## 2015-03-10 SURGERY — COLONOSCOPY
Anesthesia: General

## 2015-03-10 MED ORDER — VANCOMYCIN HCL IN DEXTROSE 1-5 GM/200ML-% IV SOLN
1000.0000 mg | Freq: Once | INTRAVENOUS | Status: AC
  Administered 2015-03-10: 1000 mg via INTRAVENOUS
  Filled 2015-03-10: qty 200

## 2015-03-10 MED ORDER — SODIUM CHLORIDE 0.9 % IV SOLN
INTRAVENOUS | Status: DC
  Administered 2015-03-10: 14:00:00 via INTRAVENOUS

## 2015-03-10 MED ORDER — PROPOFOL 500 MG/50ML IV EMUL
INTRAVENOUS | Status: DC | PRN
  Administered 2015-03-10: 110 ug/kg/min via INTRAVENOUS

## 2015-03-10 MED ORDER — GENTAMICIN IN SALINE 1-0.9 MG/ML-% IV SOLN
100.0000 mg | Freq: Once | INTRAVENOUS | Status: DC
  Filled 2015-03-10: qty 100

## 2015-03-10 MED ORDER — GENTAMICIN SULFATE 40 MG/ML IJ SOLN
100.0000 mg | Freq: Once | INTRAVENOUS | Status: AC
  Administered 2015-03-10: 100 mg via INTRAVENOUS
  Filled 2015-03-10: qty 2.5

## 2015-03-10 MED ORDER — SODIUM CHLORIDE 0.9 % IV SOLN: INTRAVENOUS | Status: DC

## 2015-03-10 MED ORDER — PROPOFOL 10 MG/ML IV BOLUS
INTRAVENOUS | Status: DC | PRN
  Administered 2015-03-10: 90 mg via INTRAVENOUS

## 2015-03-10 NOTE — Anesthesia Preprocedure Evaluation (Signed)
Anesthesia Evaluation  Patient identified by MRN, date of birth, ID band Patient awake    Reviewed: Allergy & Precautions, NPO status , Patient's Chart, lab work & pertinent test results  Airway Mallampati: I  TM Distance: >3 FB Neck ROM: Limited    Dental  (+) Teeth Intact   Pulmonary    Pulmonary exam normal        Cardiovascular Exercise Tolerance: Good hypertension, Pt. on medications + CAD and + Past MI  Normal cardiovascular exam  Stent, MI, anti-coagulant held. Has done well.   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  Renal/GU      Musculoskeletal   Abdominal (+)  Abdomen: soft.    Peds  Hematology   Anesthesia Other Findings   Reproductive/Obstetrics                             Anesthesia Physical Anesthesia Plan  ASA: III  Anesthesia Plan: General   Post-op Pain Management:    Induction: Intravenous  Airway Management Planned: Nasal Cannula  Additional Equipment:   Intra-op Plan:   Post-operative Plan:   Informed Consent: I have reviewed the patients History and Physical, chart, labs and discussed the procedure including the risks, benefits and alternatives for the proposed anesthesia with the patient or authorized representative who has indicated his/her understanding and acceptance.     Plan Discussed with: CRNA  Anesthesia Plan Comments:         Anesthesia Quick Evaluation

## 2015-03-10 NOTE — Anesthesia Postprocedure Evaluation (Signed)
Anesthesia Post Note  Patient: Joseph Mann  Procedure(s) Performed: Procedure(s) (LRB): COLONOSCOPY (N/A)  Patient location during evaluation: Endoscopy Anesthesia Type: General Level of consciousness: awake, awake and alert and oriented Pain management: pain level controlled Vital Signs Assessment: post-procedure vital signs reviewed and stable Respiratory status: spontaneous breathing Cardiovascular status: blood pressure returned to baseline Anesthetic complications: no    Last Vitals:  Filed Vitals:   03/10/15 1600 03/10/15 1610  BP: 167/93 161/85  Pulse: 48 46  Temp:    Resp: 15 14    Last Pain: There were no vitals filed for this visit.               Karsen Nakanishi

## 2015-03-10 NOTE — Op Note (Signed)
[facility] Gastroenterology Patient Name: Joseph Mann Procedure Date: 03/10/2015 2:58 PM MRN: [id number] Account #: [id number] Date of Birth: Sep 04, 1940 Admit Type: Outpatient Age: 74 Room: [facility] ENDO ROOM 1 Gender: Male Note Status: Finalized Procedure:         Colonoscopy Indications:       High risk colon cancer surveillance: Personal history of                     colonic polyps Providers:         Manya Silvas, MD Referring MD:      Ocie Cornfield. Ouida Sills, MD (Referring MD) Medicines:         Propofol per Anesthesia Complications:     No immediate complications. Procedure:         Pre-Anesthesia Assessment:                    - After reviewing the risks and benefits, the patient was                     deemed in satisfactory condition to undergo the procedure.                    After obtaining informed consent, the colonoscope was                     passed under direct vision. Throughout the procedure, the                     patient's blood pressure, pulse, and oxygen saturations                     were monitored continuously. The Colonoscope was                     introduced through the anus and advanced to the the cecum,                     identified by appendiceal orifice and ileocecal valve. The                     colonoscopy was performed without difficulty. The patient                     tolerated the procedure well. The quality of the bowel                     preparation was excellent. Findings:      A small polyp was found in the sigmoid colon. The polyp was sessile. The       polyp was removed with a cold snare. Resection and retrieval were       complete. To prevent bleeding after the polypectomy, one hemostatic clip       was successfully placed. There was no bleeding at the end of the       procedure.      A diminutive polyp was found in the transverse colon. The polyp was       sessile. The polyp was removed with a jumbo cold  forceps. Resection and       retrieval were complete.      A diminutive polyp was found in the descending colon. The polyp was       sessile. The polyp was removed with a jumbo cold forceps. Resection  and       retrieval were complete.      Internal hemorrhoids were found during endoscopy. The hemorrhoids were       small and Grade I (internal hemorrhoids that do not prolapse).      The exam was otherwise without abnormality. Impression:        - One small polyp in the sigmoid colon. Resected and                     retrieved. Clip was placed.                    - One diminutive polyp in the transverse colon. Resected                     and retrieved.                    - One diminutive polyp in the descending colon. Resected                     and retrieved.                    - Internal hemorrhoids.                    - The examination was otherwise normal. Recommendation:    - Await pathology results. Manya Silvas, MD 03/10/2015 3:30:38 PM This report has been signed electronically. Number of Addenda: 0 Note Initiated On: 03/10/2015 2:58 PM Scope Withdrawal Time: 0 hours 14 minutes 40 seconds  Total Procedure Duration: 0 hours 20 minutes 38 seconds       [facility]

## 2015-03-10 NOTE — Transfer of Care (Signed)
Immediate Anesthesia Transfer of Care Note  Patient: Joseph Mann  Procedure(s) Performed: Procedure(s) with comments: COLONOSCOPY (N/A) -  IDDM  Vancomycin/ Gentamycin Preop  Patient Location: PACU and Endoscopy Unit  Anesthesia Type:General  Level of Consciousness: awake, alert  and oriented  Airway & Oxygen Therapy: Patient Spontanous Breathing and Patient connected to nasal cannula oxygen  Post-op Assessment: Report given to RN and Post -op Vital signs reviewed and stable  Post vital signs: Reviewed and stable  Last Vitals:  Filed Vitals:   03/10/15 1335 03/10/15 1535  BP: 167/94 116/66  Pulse: 60   Temp: 36.7 C 36.3 C  Resp: 16     Complications: No apparent anesthesia complications

## 2015-03-10 NOTE — H&P (Signed)
   Primary Care Physician:  Kirk Ruths., MD Primary Gastroenterologist:  Dr. Vira Agar  Pre-Procedure History & Physical: HPI:  Joseph Mann is a 74 y.o. male is here for an colonoscopy.   Past Medical History  Diagnosis Date  . Hypertension   . Diabetes mellitus without complication ([place])   . MI (myocardial infarction) [facility])     Past Surgical History  Procedure Laterality Date  . Replacement total knee    . Back surgery    . Cholecystectomy    . Hernia repair      Prior to Admission medications   Medication Sig Start Date End Date Taking? Authorizing Provider  aspirin EC 81 MG tablet Take 81 mg by mouth daily.   Yes Historical Provider, MD  metFORMIN (GLUCOPHAGE-XR) 500 MG 24 hr tablet Take 1,000 mg by mouth daily.   Yes Historical Provider, MD  prasugrel (EFFIENT) 10 MG TABS tablet Take 10 mg by mouth daily.   Yes Historical Provider, MD  pravastatin (PRAVACHOL) 20 MG tablet Take 20 mg by mouth at bedtime.   Yes Historical Provider, MD  clindamycin (CLEOCIN) 300 MG capsule Take 1 capsule (300 mg total) by mouth 3 (three) times daily. 02/10/15   Loney Hering, MD  HYDROcodone-acetaminophen (NORCO/VICODIN) 5-325 MG tablet Take 1 tablet by mouth every 4 (four) hours as needed for moderate pain. Patient not taking: Reported on 03/10/2015 02/10/15   Loney Hering, MD  loratadine (CLARITIN) 10 MG tablet Take 10 mg by mouth daily.    Historical Provider, MD    Allergies as of 01/31/2015  . (Not on File)    History reviewed. No pertinent family history.  Social History   Social History  . Marital Status: Married    Spouse Name: N/A  . Number of Children: N/A  . Years of Education: N/A   Occupational History  . Not on file.   Social History Main Topics  . Smoking status: Never Smoker   . Smokeless tobacco: Not on file  . Alcohol Use: No  . Drug Use: No  . Sexual Activity: Not on file   Other Topics Concern  . Not on file   Social History  Narrative    Review of Systems: See HPI, otherwise negative ROS  Physical Exam: BP 161/85 mmHg  Pulse 46  Temp(Src) 97 F (36.1 C) (Tympanic)  Resp 14  Ht 6\' 2"  (1.88 m)  Wt 95.255 kg (210 lb)  BMI 26.95 kg/m2  SpO2 97% General:   Alert,  pleasant and cooperative in NAD Head:  Normocephalic and atraumatic. Neck:  Supple; no masses or thyromegaly. Lungs:  Clear throughout to auscultation.    Heart:  Regular rate and rhythm. Abdomen:  Soft, nontender and nondistended. Normal bowel sounds, without guarding, and without rebound.   Neurologic:  Alert and  oriented x4;  grossly normal neurologically.  Impression/Plan: Joseph Mann is here for an colonoscopy to be performed for Personal history of colon polyps.  He will need antibiotics for his artifical knee.    Risks, benefits, limitations, and alternatives regarding  endoscopy have been reviewed with the patient.  Questions have been answered.  All parties agreeable.   Gaylyn Cheers, MD  03/10/2015, 5:25 PM

## 2015-03-12 ENCOUNTER — Encounter: Payer: Self-pay | Admitting: Unknown Physician Specialty

## 2015-03-12 LAB — SURGICAL PATHOLOGY

## 2015-05-15 ENCOUNTER — Encounter: Payer: Self-pay | Admitting: *Deleted

## 2015-05-15 ENCOUNTER — Encounter
Admission: RE | Disposition: A | Discharge status: Home / Self Care | Payer: Self-pay | Source: Ambulatory Visit | Attending: Internal Medicine

## 2015-05-15 ENCOUNTER — Ambulatory Visit
Admission: RE | Admit: 2015-05-15 | Discharge: 2015-05-15 | Disposition: A | Discharge status: Home / Self Care | Payer: Federal, State, Local not specified - PPO | Source: Ambulatory Visit | Attending: Internal Medicine | Admitting: Internal Medicine

## 2015-05-15 DIAGNOSIS — E785 Hyperlipidemia, unspecified: Secondary | ICD-10-CM | POA: Insufficient documentation

## 2015-05-15 DIAGNOSIS — Z9049 Acquired absence of other specified parts of digestive tract: Secondary | ICD-10-CM | POA: Insufficient documentation

## 2015-05-15 DIAGNOSIS — Z888 Allergy status to other drugs, medicaments and biological substances status: Secondary | ICD-10-CM | POA: Insufficient documentation

## 2015-05-15 DIAGNOSIS — Z7984 Long term (current) use of oral hypoglycemic drugs: Secondary | ICD-10-CM | POA: Diagnosis not present

## 2015-05-15 DIAGNOSIS — I25110 Atherosclerotic heart disease of native coronary artery with unstable angina pectoris: Secondary | ICD-10-CM | POA: Insufficient documentation

## 2015-05-15 DIAGNOSIS — R079 Chest pain, unspecified: Secondary | ICD-10-CM | POA: Diagnosis present

## 2015-05-15 DIAGNOSIS — I10 Essential (primary) hypertension: Secondary | ICD-10-CM | POA: Insufficient documentation

## 2015-05-15 DIAGNOSIS — N400 Enlarged prostate without lower urinary tract symptoms: Secondary | ICD-10-CM | POA: Diagnosis not present

## 2015-05-15 DIAGNOSIS — Z955 Presence of coronary angioplasty implant and graft: Secondary | ICD-10-CM | POA: Diagnosis not present

## 2015-05-15 DIAGNOSIS — Z8379 Family history of other diseases of the digestive system: Secondary | ICD-10-CM | POA: Diagnosis not present

## 2015-05-15 DIAGNOSIS — Z7982 Long term (current) use of aspirin: Secondary | ICD-10-CM | POA: Diagnosis not present

## 2015-05-15 DIAGNOSIS — Z96652 Presence of left artificial knee joint: Secondary | ICD-10-CM | POA: Diagnosis not present

## 2015-05-15 DIAGNOSIS — R0600 Dyspnea, unspecified: Secondary | ICD-10-CM | POA: Insufficient documentation

## 2015-05-15 DIAGNOSIS — Z91041 Radiographic dye allergy status: Secondary | ICD-10-CM | POA: Insufficient documentation

## 2015-05-15 DIAGNOSIS — R0602 Shortness of breath: Secondary | ICD-10-CM | POA: Diagnosis not present

## 2015-05-15 DIAGNOSIS — Z8249 Family history of ischemic heart disease and other diseases of the circulatory system: Secondary | ICD-10-CM | POA: Diagnosis not present

## 2015-05-15 DIAGNOSIS — G43909 Migraine, unspecified, not intractable, without status migrainosus: Secondary | ICD-10-CM | POA: Insufficient documentation

## 2015-05-15 DIAGNOSIS — Z86010 Personal history of colonic polyps: Secondary | ICD-10-CM | POA: Insufficient documentation

## 2015-05-15 DIAGNOSIS — Z79899 Other long term (current) drug therapy: Secondary | ICD-10-CM | POA: Diagnosis not present

## 2015-05-15 DIAGNOSIS — E119 Type 2 diabetes mellitus without complications: Secondary | ICD-10-CM | POA: Diagnosis not present

## 2015-05-15 HISTORY — DX: Gastro-esophageal reflux disease without esophagitis: K21.9

## 2015-05-15 HISTORY — DX: Headache: R51

## 2015-05-15 HISTORY — DX: Headache, unspecified: R51.9

## 2015-05-15 HISTORY — PX: CARDIAC CATHETERIZATION: SHX172

## 2015-05-15 HISTORY — DX: Hyperlipidemia, unspecified: E78.5

## 2015-05-15 HISTORY — DX: Benign prostatic hyperplasia without lower urinary tract symptoms: N40.0

## 2015-05-15 LAB — GLUCOSE, CAPILLARY: Glucose-Capillary: 209 mg/dL — ABNORMAL HIGH (ref 65–99)

## 2015-05-15 SURGERY — LEFT HEART CATH AND CORONARY ANGIOGRAPHY
Anesthesia: Moderate Sedation | Laterality: Left

## 2015-05-15 MED ORDER — HEPARIN (PORCINE) IN NACL 2-0.9 UNIT/ML-% IJ SOLN
INTRAMUSCULAR | Status: AC
  Filled 2015-05-15: qty 500

## 2015-05-15 MED ORDER — IOHEXOL 300 MG/ML  SOLN
INTRAMUSCULAR | Status: DC | PRN
  Administered 2015-05-15: 110 mL via INTRA_ARTERIAL

## 2015-05-15 MED ORDER — SODIUM CHLORIDE 0.9 % IV SOLN
INTRAVENOUS | Status: DC
  Administered 2015-05-15: 11:00:00 via INTRAVENOUS

## 2015-05-15 MED ORDER — SODIUM CHLORIDE 0.9% FLUSH: 3.0000 mL | Freq: Two times a day (BID) | INTRAVENOUS | Status: DC

## 2015-05-15 MED ORDER — FENTANYL CITRATE (PF) 100 MCG/2ML IJ SOLN
INTRAMUSCULAR | Status: DC | PRN
  Administered 2015-05-15: 25 ug via INTRAVENOUS

## 2015-05-15 MED ORDER — MIDAZOLAM HCL 2 MG/2ML IJ SOLN
INTRAMUSCULAR | Status: DC | PRN
  Administered 2015-05-15: 1 mg via INTRAVENOUS

## 2015-05-15 MED ORDER — SODIUM CHLORIDE 0.9% FLUSH: 3.0000 mL | INTRAVENOUS | Status: DC | PRN

## 2015-05-15 MED ORDER — FENTANYL CITRATE (PF) 100 MCG/2ML IJ SOLN
INTRAMUSCULAR | Status: AC
  Filled 2015-05-15: qty 2

## 2015-05-15 MED ORDER — MIDAZOLAM HCL 2 MG/2ML IJ SOLN
INTRAMUSCULAR | Status: AC
  Filled 2015-05-15: qty 2

## 2015-05-15 SURGICAL SUPPLY — 10 items
CATH INFINITI 5FR ANG PIGTAIL (CATHETERS) ×3 IMPLANT
CATH INFINITI 5FR JL4 (CATHETERS) ×3 IMPLANT
CATH INFINITI JR4 5F (CATHETERS) ×3 IMPLANT
DEVICE CLOSURE MYNXGRIP 5F (Vascular Products) ×2 IMPLANT
KIT MANI 3VAL PERCEP (MISCELLANEOUS) ×3 IMPLANT
NDL PERC 18GX7CM (NEEDLE) ×1 IMPLANT
NEEDLE PERC 18GX7CM (NEEDLE) ×3 IMPLANT
PACK CARDIAC CATH (CUSTOM PROCEDURE TRAY) ×3 IMPLANT
SHEATH AVANTI 5FR X 11CM (SHEATH) ×3 IMPLANT
WIRE EMERALD 3MM-J .035X150CM (WIRE) ×3 IMPLANT

## 2015-05-15 NOTE — Discharge Instructions (Signed)
Groin Insertion Instructions-If you lose feeling or develop tingling or pain in your leg or foot after the procedure, please walk around first.  If the discomfort does not improve , contact your physician and proceed to the nearest emergency room.  Loss of feeling in your leg might mean that a blockage has formed in the artery and this can be appropriately treated.  Limit your activity for the next two days after your procedure.  Avoid stooping, bending, heavy lifting or exertion as this may put pressure on the insertion site.  Resume normal activities in 48 hours.  You may shower after 24 hours but avoid excessive warm water and do not scrub the site.  Remove clear dressing in 48 hours.  If you have had a closure device inserted, do not soak in a tub bath or a hot tub for at least one week.  No driving for 48 hours after discharge.  After the procedure, check the insertion site occasionally.  If any oozing occurs or there is apparent swelling, firm pressure over the site will prevent a bruise from forming.  You can not hurt anything by pressing directly on the site.  The pressure stops the bleeding by allowing a small clot to form.  If the bleeding continues after the pressure has been applied for more than 15 minutes, call 911 or go to the nearest emergency room.    The x-ray dye causes you to pass a considerate amount of urine.  For this reason, you will be asked to drink plenty of liquids after the procedure to prevent dehydration.  You may resume you regular diet.  Avoid caffeine products.    For pain at the site of your procedure, take non-aspirin medicines such as Tylenol.  Medications: A. Hold Metformin for 48 hours if applicable.  B. Continue taking all your present medications at home unless your doctor prescribes any changes. PATIENT MAY STAY OFF OF EFFIENT PERMENANTLY IF HE SO DESIRES AT THIS TIME PER CONFERENCE AT BEDSIDE WITH PAtIENT AND WIFE.  THEY VERBALIZE UNDERSTANDING

## 2015-05-22 ENCOUNTER — Ambulatory Visit
Admission: RE | Admit: 2015-05-22 | Discharge: 2015-05-22 | Disposition: A | Discharge status: Home / Self Care | Payer: Federal, State, Local not specified - PPO | Source: Ambulatory Visit | Attending: Unknown Physician Specialty | Admitting: Unknown Physician Specialty

## 2015-05-22 ENCOUNTER — Encounter: Payer: Self-pay | Admitting: *Deleted

## 2015-05-22 ENCOUNTER — Ambulatory Visit: Payer: Federal, State, Local not specified - PPO | Admitting: Anesthesiology

## 2015-05-22 ENCOUNTER — Encounter
Admission: RE | Disposition: A | Discharge status: Home / Self Care | Payer: Self-pay | Source: Ambulatory Visit | Attending: Unknown Physician Specialty

## 2015-05-22 DIAGNOSIS — K219 Gastro-esophageal reflux disease without esophagitis: Secondary | ICD-10-CM | POA: Diagnosis not present

## 2015-05-22 DIAGNOSIS — Z881 Allergy status to other antibiotic agents status: Secondary | ICD-10-CM | POA: Insufficient documentation

## 2015-05-22 DIAGNOSIS — Z888 Allergy status to other drugs, medicaments and biological substances status: Secondary | ICD-10-CM | POA: Diagnosis not present

## 2015-05-22 DIAGNOSIS — N400 Enlarged prostate without lower urinary tract symptoms: Secondary | ICD-10-CM | POA: Insufficient documentation

## 2015-05-22 DIAGNOSIS — R0789 Other chest pain: Secondary | ICD-10-CM | POA: Insufficient documentation

## 2015-05-22 DIAGNOSIS — E119 Type 2 diabetes mellitus without complications: Secondary | ICD-10-CM | POA: Diagnosis not present

## 2015-05-22 DIAGNOSIS — E785 Hyperlipidemia, unspecified: Secondary | ICD-10-CM | POA: Diagnosis not present

## 2015-05-22 DIAGNOSIS — G43809 Other migraine, not intractable, without status migrainosus: Secondary | ICD-10-CM | POA: Insufficient documentation

## 2015-05-22 DIAGNOSIS — K222 Esophageal obstruction: Secondary | ICD-10-CM | POA: Diagnosis not present

## 2015-05-22 DIAGNOSIS — Z7982 Long term (current) use of aspirin: Secondary | ICD-10-CM | POA: Insufficient documentation

## 2015-05-22 DIAGNOSIS — I252 Old myocardial infarction: Secondary | ICD-10-CM | POA: Insufficient documentation

## 2015-05-22 DIAGNOSIS — I10 Essential (primary) hypertension: Secondary | ICD-10-CM | POA: Insufficient documentation

## 2015-05-22 DIAGNOSIS — Z96659 Presence of unspecified artificial knee joint: Secondary | ICD-10-CM | POA: Insufficient documentation

## 2015-05-22 DIAGNOSIS — Z91041 Radiographic dye allergy status: Secondary | ICD-10-CM | POA: Insufficient documentation

## 2015-05-22 DIAGNOSIS — R079 Chest pain, unspecified: Secondary | ICD-10-CM | POA: Diagnosis present

## 2015-05-22 DIAGNOSIS — Z7984 Long term (current) use of oral hypoglycemic drugs: Secondary | ICD-10-CM | POA: Diagnosis not present

## 2015-05-22 DIAGNOSIS — Z79899 Other long term (current) drug therapy: Secondary | ICD-10-CM | POA: Insufficient documentation

## 2015-05-22 HISTORY — PX: ESOPHAGOGASTRODUODENOSCOPY (EGD) WITH PROPOFOL: SHX5813

## 2015-05-22 LAB — GLUCOSE, CAPILLARY: Glucose-Capillary: 207 mg/dL — ABNORMAL HIGH (ref 65–99)

## 2015-05-22 SURGERY — ESOPHAGOGASTRODUODENOSCOPY (EGD) WITH PROPOFOL
Anesthesia: General

## 2015-05-22 MED ORDER — GENTAMICIN SULFATE 40 MG/ML IJ SOLN
100.0000 mg | Freq: Once | INTRAVENOUS | Status: AC
  Administered 2015-05-22: 100 mg via INTRAVENOUS
  Filled 2015-05-22: qty 2.5

## 2015-05-22 MED ORDER — SODIUM CHLORIDE 0.9 % IV SOLN: INTRAVENOUS | Status: DC

## 2015-05-22 MED ORDER — PROPOFOL 10 MG/ML IV BOLUS
INTRAVENOUS | Status: DC | PRN
  Administered 2015-05-22: 100 mg via INTRAVENOUS

## 2015-05-22 MED ORDER — SODIUM CHLORIDE 0.9 % IV SOLN
INTRAVENOUS | Status: DC
  Administered 2015-05-22 (×2): via INTRAVENOUS

## 2015-05-22 MED ORDER — VANCOMYCIN HCL IN DEXTROSE 1-5 GM/200ML-% IV SOLN
1000.0000 mg | Freq: Once | INTRAVENOUS | Status: AC
  Administered 2015-05-22: 1000 mg via INTRAVENOUS
  Filled 2015-05-22: qty 200

## 2015-05-22 NOTE — Anesthesia Postprocedure Evaluation (Signed)
Anesthesia Post Note  Patient: Joseph Mann  Procedure(s) Performed: Procedure(s) (LRB): ESOPHAGOGASTRODUODENOSCOPY (EGD) WITH PROPOFOL (N/A)  Patient location during evaluation: Endoscopy Anesthesia Type: General Level of consciousness: awake and alert Pain management: pain level controlled Vital Signs Assessment: post-procedure vital signs reviewed and stable Respiratory status: spontaneous breathing, nonlabored ventilation, respiratory function stable and patient connected to nasal cannula oxygen Cardiovascular status: blood pressure returned to baseline and stable Postop Assessment: no signs of nausea or vomiting Anesthetic complications: no    Last Vitals:  Filed Vitals:   05/22/15 1134 05/22/15 1144  BP: 144/86 158/86  Pulse: 51 50  Temp:    Resp: 16 17    Last Pain: There were no vitals filed for this visit.               Precious Haws [place]

## 2015-05-22 NOTE — H&P (Signed)
Primary Care Physician:  Kirk Ruths., MD Primary Gastroenterologist:  Dr. Vira Agar  Pre-Procedure History & Physical: HPI:  Joseph Mann is a 75 y.o. male is here for an endoscopy.   Past Medical History  Diagnosis Date  . Hypertension   . Diabetes mellitus without complication ([place])   . MI (myocardial infarction) ([place])   . GERD (gastroesophageal reflux disease)   . Headache     migraines  . BPH (benign prostatic hyperplasia)   . Hyperlipidemia     Past Surgical History  Procedure Laterality Date  . Replacement total knee    . Back surgery    . Cholecystectomy    . Hernia repair    . Colonoscopy N/A 03/10/2015    Procedure: COLONOSCOPY;  Surgeon: Manya Silvas, MD;  Location[facility] ENDOSCOPY;  Service: Endoscopy;  Laterality: N/A;   IDDM  Vancomycin/ Gentamycin Preop  . Cardiac catheterization Left 05/15/2015    Procedure: Left Heart Cath and Coronary Angiography;  Surgeon: Yolonda Kida, MD;  Location: [place] CV LAB;  Service: Cardiovascular;  Laterality: Left;    Prior to Admission medications   Medication Sig Start Date End Date Taking? Authorizing Provider  loratadine (CLARITIN) 10 MG tablet Take 10 mg by mouth daily.   Yes Historical Provider, MD  metFORMIN (GLUCOPHAGE-XR) 500 MG 24 hr tablet Take 1,000 mg by mouth daily.   Yes Historical Provider, MD  omeprazole (PRILOSEC) 20 MG capsule Take 20 mg by mouth daily.   Yes Historical Provider, MD  pravastatin (PRAVACHOL) 20 MG tablet Take 20 mg by mouth at bedtime.   Yes Historical Provider, MD  aspirin EC 81 MG tablet Take 81 mg by mouth daily. Reported on 05/15/2015    Historical Provider, MD  clindamycin (CLEOCIN) 300 MG capsule Take 1 capsule (300 mg total) by mouth 3 (three) times daily. 02/10/15   Loney Hering, MD  HYDROcodone-acetaminophen (NORCO/VICODIN) 5-325 MG tablet Take 1 tablet by mouth every 4 (four) hours as needed for moderate pain. Patient not taking: Reported on 03/10/2015  02/10/15   Loney Hering, MD  prasugrel (EFFIENT) 10 MG TABS tablet Take 10 mg by mouth daily. Reported on 05/22/2015    Historical Provider, MD    Allergies as of 05/16/2015 - Review Complete 05/15/2015  Allergen Reaction Noted  . Amoxicillin Hives 02/10/2015  . Iodinated diagnostic agents Other (See Comments) 03/05/2015  . Lipitor [atorvastatin]  03/05/2015  . Plavix [clopidogrel] Hives 02/10/2015    History reviewed. No pertinent family history.  Social History   Social History  . Marital Status: Married    Spouse Name: N/A  . Number of Children: N/A  . Years of Education: N/A   Occupational History  . Not on file.   Social History Main Topics  . Smoking status: Never Smoker   . Smokeless tobacco: Not on file  . Alcohol Use: No     Comment: occasional  . Drug Use: No  . Sexual Activity: Not on file   Other Topics Concern  . Not on file   Social History Narrative    Review of Systems: See HPI, otherwise negative ROS  Physical Exam: BP 155/91 mmHg  Pulse 62  Temp(Src) 97.5 F (36.4 C) (Tympanic)  Resp 16  SpO2 100% General:   Alert,  pleasant and cooperative in NAD Head:  Normocephalic and atraumatic. Neck:  Supple; no masses or thyromegaly. Lungs:  Clear throughout to auscultation.    Heart:  Regular rate and rhythm. Abdomen:  Soft, nontender and nondistended. Normal bowel sounds, without guarding, and without rebound.   Neurologic:  Alert and  oriented x4;  grossly normal neurologically.  Impression/Plan: Joseph Mann is here for an endoscopy to be performed for non cardiac chest pain  Risks, benefits, limitations, and alternatives regarding  endoscopy have been reviewed with the patient.  Questions have been answered.  All parties agreeable.   Gaylyn Cheers, MD  05/22/2015, 10:50 AM

## 2015-05-22 NOTE — Anesthesia Preprocedure Evaluation (Addendum)
Anesthesia Evaluation  Patient identified by MRN, date of birth, ID band Patient awake    Reviewed: Allergy & Precautions, H&P , NPO status , Patient's Chart, lab work & pertinent test results  History of Anesthesia Complications Negative for: history of anesthetic complications  Airway Mallampati: III  TM Distance: >3 FB Neck ROM: limited    Dental  (+) Poor Dentition, Chipped   Pulmonary neg pulmonary ROS, neg shortness of breath,    Pulmonary exam normal breath sounds clear to auscultation       Cardiovascular Exercise Tolerance: Good hypertension, (-) angina+ CAD, + Past MI and + Cardiac Stents  (-) DOE Normal cardiovascular exam Rhythm:regular Rate:Normal     Neuro/Psych  Headaches, negative psych ROS   GI/Hepatic Neg liver ROS, GERD  Controlled,  Endo/Other  diabetes, Type 2  Renal/GU negative Renal ROS  negative genitourinary   Musculoskeletal   Abdominal   Peds  Hematology negative hematology ROS (+)   Anesthesia Other Findings Past Medical History:   Hypertension                                                 Diabetes mellitus without complication (HCC)                 MI (myocardial infarction) ([place])                             GERD (gastroesophageal reflux disease)                       Headache                                                       Comment:migraines   BPH (benign prostatic hyperplasia)                           Hyperlipidemia                                              Past Surgical History:   REPLACEMENT TOTAL KNEE                                        BACK SURGERY                                                  CHOLECYSTECTOMY                                               HERNIA REPAIR  COLONOSCOPY                                     N/A 03/10/2015     Comment:Procedure: COLONOSCOPY;  Surgeon: Manya Silvas,  MD;  Location: [facility] ENDOSCOPY;                Service: Endoscopy;  Laterality: N/A;   IDDM                Vancomycin/ Gentamycin Preop   CARDIAC CATHETERIZATION                         Left 05/15/2015       Comment:Procedure: Left Heart Cath and Coronary               Angiography;  Surgeon: Yolonda Kida, MD;                Location: [place] CV LAB;  Service:               Cardiovascular;  Laterality: Left;     Reproductive/Obstetrics negative OB ROS                             Anesthesia Physical Anesthesia Plan  ASA: III  Anesthesia Plan: General   Post-op Pain Management:    Induction:   Airway Management Planned:   Additional Equipment:   Intra-op Plan:   Post-operative Plan:   Informed Consent: I have reviewed the patients History and Physical, chart, labs and discussed the procedure including the risks, benefits and alternatives for the proposed anesthesia with the patient or authorized representative who has indicated his/her understanding and acceptance.   Dental Advisory Given  Plan Discussed with: Anesthesiologist, CRNA and Surgeon  Anesthesia Plan Comments:         Anesthesia Quick Evaluation

## 2015-05-22 NOTE — Transfer of Care (Signed)
Immediate Anesthesia Transfer of Care Note  Patient: Joseph Mann  Procedure(s) Performed: Procedure(s): ESOPHAGOGASTRODUODENOSCOPY (EGD) WITH PROPOFOL (N/A)  Patient Location: PACU  Anesthesia Type:General  Level of Consciousness: awake, alert  and oriented  Airway & Oxygen Therapy: Patient Spontanous Breathing and Patient connected to nasal cannula oxygen  Post-op Assessment: Report given to RN and Post -op Vital signs reviewed and stable  Post vital signs: Reviewed and stable  Last Vitals:  Filed Vitals:   05/22/15 0816 05/22/15 1100  BP: 155/91   Pulse: 62   Temp: 36.4 C 35.6 C  Resp: 16     Complications: No apparent anesthesia complications

## 2015-05-22 NOTE — Op Note (Signed)
[facility] Gastroenterology Patient Name: Joseph Mann Procedure Date: 05/22/2015 10:58 AM MRN: [id number] Account #: [id number] Date of Birth: Jan 08, 1941 Admit Type: Outpatient Age: 75 Room: [facility] ENDO ROOM 1 Gender: Male Note Status: Finalized Procedure:         Upper GI endoscopy Indications:       Unexplained chest pain Providers:         Manya Silvas, MD Referring MD:      Ocie Cornfield. Ouida Sills, MD (Referring MD) Medicines:         Propofol per Anesthesia Complications:     No immediate complications. Procedure:         Pre-Anesthesia Assessment:                    - After reviewing the risks and benefits, the patient was                     deemed in satisfactory condition to undergo the procedure.                    After obtaining informed consent, the endoscope was passed                     under direct vision. Throughout the procedure, the                     patient's blood pressure, pulse, and oxygen saturations                     were monitored continuously. The Olympus GIF-160 endoscope                     (S#. [id number]) was introduced through the mouth, and                     advanced to the second part of duodenum. The upper GI                     endoscopy was accomplished without difficulty. The patient                     tolerated the procedure well. Findings:      A mild Schatzki ring (acquired) was found at the gastroesophageal       junction. A guidewire was placed and the scope was withdrawn. Dilation       was performed with a Savary dilator with mild resistance at 17 mm.      Abnormal motility was noted in the lower third of the esophagus. The       cricopharyngeus was abnormal. There is spasticity of the esophageal       body. The distal esophagus/lower esophageal sphincter is spastic, but       gives up passage to the endoscope. Primary peristaltic waves are noted.      The stomach was normal.      The examined duodenum was  normal. Impression:        - Mild Schatzki ring. Dilated.                    - The examination was suspicious for esophageal spasm.                    - Normal stomach.                    -  Normal examined duodenum.                    - No specimens collected. Recommendation:    - soft food for 3 days, eat slowly, chew well, take small                     bites. Drink warm beverages with meals. Manya Silvas, MD 05/22/2015 11:10:49 AM This report has been signed electronically. Number of Addenda: 0 Note Initiated On: 05/22/2015 10:58 AM      [facility]

## 2016-12-31 ENCOUNTER — Emergency Department: Payer: Federal, State, Local not specified - PPO

## 2016-12-31 ENCOUNTER — Emergency Department
Admission: EM | Admit: 2016-12-31 | Discharge: 2016-12-31 | Disposition: A | Discharge status: Home / Self Care | Payer: Federal, State, Local not specified - PPO | Attending: Emergency Medicine | Admitting: Emergency Medicine

## 2016-12-31 ENCOUNTER — Encounter: Payer: Self-pay | Admitting: Emergency Medicine

## 2016-12-31 DIAGNOSIS — E119 Type 2 diabetes mellitus without complications: Secondary | ICD-10-CM | POA: Diagnosis not present

## 2016-12-31 DIAGNOSIS — Z7982 Long term (current) use of aspirin: Secondary | ICD-10-CM | POA: Diagnosis not present

## 2016-12-31 DIAGNOSIS — R0602 Shortness of breath: Secondary | ICD-10-CM | POA: Insufficient documentation

## 2016-12-31 DIAGNOSIS — Z7901 Long term (current) use of anticoagulants: Secondary | ICD-10-CM | POA: Insufficient documentation

## 2016-12-31 DIAGNOSIS — I10 Essential (primary) hypertension: Secondary | ICD-10-CM | POA: Insufficient documentation

## 2016-12-31 DIAGNOSIS — Z79899 Other long term (current) drug therapy: Secondary | ICD-10-CM | POA: Insufficient documentation

## 2016-12-31 DIAGNOSIS — I4891 Unspecified atrial fibrillation: Secondary | ICD-10-CM | POA: Insufficient documentation

## 2016-12-31 DIAGNOSIS — Z7984 Long term (current) use of oral hypoglycemic drugs: Secondary | ICD-10-CM | POA: Diagnosis not present

## 2016-12-31 DIAGNOSIS — R079 Chest pain, unspecified: Secondary | ICD-10-CM | POA: Diagnosis present

## 2016-12-31 LAB — BRAIN NATRIURETIC PEPTIDE: B Natriuretic Peptide: 388 pg/mL — ABNORMAL HIGH (ref 0.0–100.0)

## 2016-12-31 LAB — BASIC METABOLIC PANEL
Anion gap: 10 (ref 5–15)
BUN: 18 mg/dL (ref 6–20)
CO2: 25 mmol/L (ref 22–32)
Calcium: 9.3 mg/dL (ref 8.9–10.3)
Chloride: 105 mmol/L (ref 101–111)
Creatinine, Ser: 1.03 mg/dL (ref 0.61–1.24)
GFR calc Af Amer: >60 mL/min (ref >60)
GFR calc non Af Amer: >60 mL/min (ref >60)
Glucose, Bld: 281 mg/dL — ABNORMAL HIGH (ref 65–99)
POTASSIUM: 4 mmol/L (ref 3.5–5.1)
SODIUM: 140 mmol/L (ref 135–145)

## 2016-12-31 LAB — HEPATIC FUNCTION PANEL
ALK PHOS: 65 U/L (ref 38–126)
ALT: 27 U/L (ref 17–63)
AST: 27 U/L (ref 15–41)
Albumin: 4.4 g/dL (ref 3.5–5.0)
BILIRUBIN INDIRECT: 1.4 mg/dL — AB (ref 0.3–0.9)
Bilirubin, Direct: 0.1 mg/dL (ref 0.1–0.5)
TOTAL PROTEIN: 7.5 g/dL (ref 6.5–8.1)
Total Bilirubin: 1.5 mg/dL — ABNORMAL HIGH (ref 0.3–1.2)

## 2016-12-31 LAB — TSH: TSH: 6.683 u[IU]/mL — ABNORMAL HIGH (ref 0.350–4.500)

## 2016-12-31 LAB — TROPONIN I: Troponin I: <0.03 ng/mL (ref <0.03)

## 2016-12-31 MED ORDER — MAGNESIUM SULFATE 4 GM/100ML IV SOLN
4.0000 g | Freq: Once | INTRAVENOUS | Status: AC
  Administered 2016-12-31: 4 g via INTRAVENOUS
  Filled 2016-12-31: qty 100

## 2016-12-31 MED ORDER — ASPIRIN 81 MG PO CHEW
324.0000 mg | CHEWABLE_TABLET | Freq: Once | ORAL | Status: AC
Start: 2016-12-31 — End: 2016-12-31
  Administered 2016-12-31: 324 mg via ORAL
  Filled 2016-12-31: qty 4

## 2016-12-31 MED ORDER — METOPROLOL TARTRATE 50 MG PO TABS: 50.0000 mg | ORAL_TABLET | Freq: Two times a day (BID) | ORAL | 0 refills | Status: DC

## 2016-12-31 MED ORDER — METOPROLOL TARTRATE 50 MG PO TABS
50.0000 mg | ORAL_TABLET | Freq: Once | ORAL | Status: AC
  Administered 2016-12-31: 50 mg via ORAL
  Filled 2016-12-31: qty 1

## 2016-12-31 MED ORDER — ELIQUIS 5 MG VTE STARTER PACK: ORAL_TABLET | ORAL | 0 refills | Status: DC

## 2016-12-31 NOTE — ED Triage Notes (Signed)
Patient presents to ED via POV from home with c/o CP since yesterday. Patient in new onset a fib with RVR. Patient ambulatory. Reports dizziness and SOB as well. A&O x4.

## 2016-12-31 NOTE — Discharge Instructions (Signed)
Please make an appointment to follow-up with your cardiologist Dr. Clayborn Bigness this coming Monday for a reevaluation.  Take your metoprolol twice a day as prescribed and begin taking your eliquis to help prevent a stroke.  Return to the ED for any new or worsening symptoms such as lightheadedness, if you pass out, develop chest pain, or for any other concerns whatsoever.  It was a pleasure to take care of you today, and thank you for coming to our emergency department.  If you have any questions or concerns before leaving please ask the nurse to grab me and I'm more than happy to go through your aftercare instructions again.  If you were prescribed any opioid pain medication today such as Norco, Vicodin, Percocet, morphine, hydrocodone, or oxycodone please make sure you do not drive when you are taking this medication as it can alter your ability to drive safely.  If you have any concerns once you are home that you are not improving or are in fact getting worse before you can make it to your follow-up appointment, please do not hesitate to call 911 and come back for further evaluation.  Darel Hong, MD  Results for orders placed or performed during the hospital encounter of 35/45/62  Basic metabolic panel  Result Value Ref Range   Sodium 140 135 - 145 mmol/L   Potassium 4.0 3.5 - 5.1 mmol/L   Chloride 105 101 - 111 mmol/L   CO2 25 22 - 32 mmol/L   Glucose, Bld 281 (H) 65 - 99 mg/dL   BUN 18 6 - 20 mg/dL   Creatinine, Ser 1.03 0.61 - 1.24 mg/dL   Calcium 9.3 8.9 - 10.3 mg/dL   GFR calc non Af Amer >60 >60 mL/min   GFR calc Af Amer >60 >60 mL/min   Anion gap 10 5 - 15  Hepatic function panel  Result Value Ref Range   Total Protein 7.5 6.5 - 8.1 g/dL   Albumin 4.4 3.5 - 5.0 g/dL   AST 27 15 - 41 U/L   ALT 27 17 - 63 U/L   Alkaline Phosphatase 65 38 - 126 U/L   Total Bilirubin 1.5 (H) 0.3 - 1.2 mg/dL   Bilirubin, Direct 0.1 0.1 - 0.5 mg/dL   Indirect Bilirubin 1.4 (H) 0.3 - 0.9 mg/dL    Brain natriuretic peptide  Result Value Ref Range   B Natriuretic Peptide 388.0 (H) 0.0 - 100.0 pg/mL  Troponin I  Result Value Ref Range   Troponin I <0.03 <0.03 ng/mL  TSH  Result Value Ref Range   TSH 6.683 (H) 0.350 - 4.500 uIU/mL   Dg Chest Port 1 View  Result Date: 12/31/2016 CLINICAL DATA:  Tachycardia EXAM: PORTABLE CHEST 1 VIEW COMPARISON:  08/06/2013 FINDINGS: Heart and mediastinal contours are within normal limits. No focal opacities or effusions. No acute bony abnormality. IMPRESSION: No active disease. Electronically Signed   By: Rolm Baptise M.D.   On: 12/31/2016 12:24

## 2016-12-31 NOTE — ED Notes (Signed)
ED Provider at bedside. 

## 2016-12-31 NOTE — ED Provider Notes (Signed)
[facility] Emergency Department Provider Note  ____________________________________________   First MD Initiated Contact with Patient 12/31/16 1154     (approximate)  I have reviewed the triage vital signs and the nursing notes.   HISTORY  Chief Complaint Chest Pain   HPI Joseph Mann is a 76 y.o. male who self presents to the emergency department with palpitations and moderate severity aching chest discomfort that began suddenly at 10 PM yesterday. He does have a past medical history of coronary artery disease and a previous myocardial infarction. He formerly took both aspirin and Plavix but is currently only taking aspirin secondary to a bleeding issue. His symptoms are been constant ever since last night. They are nonexertional. He does have mild shortness of breath. This does not feel like his previous heart attack. No nausea or vomiting. Nothing seems to make his symptoms better or worse. He has never had atrial fibrillation before.  05/15/15 Cath:  Dist LAD lesion, 40% stenosed. The lesion was not previously treated.  normal left ventricular function ejection fraction of at least 55%  Widely patent proximal LAD stent no significant stenosis  Study suggest noncardiac chest pain continue current medical therapy   Past Medical History:  Diagnosis Date  . BPH (benign prostatic hyperplasia)   . Diabetes mellitus without complication ([place])   . GERD (gastroesophageal reflux disease)   . Headache    migraines  . Hyperlipidemia   . Hypertension   . MI (myocardial infarction) ([place])     There are no active problems to display for this patient.   Past Surgical History:  Procedure Laterality Date  . BACK SURGERY    . CARDIAC CATHETERIZATION Left 05/15/2015   Procedure: Left Heart Cath and Coronary Angiography;  Surgeon: Yolonda Kida, MD;  Location: [place] CV LAB;  Service: Cardiovascular;  Laterality: Left;  . CHOLECYSTECTOMY    .  COLONOSCOPY N/A 03/10/2015   Procedure: COLONOSCOPY;  Surgeon: Manya Silvas, MD;  Location: [facility] ENDOSCOPY;  Service: Endoscopy;  Laterality: N/A;   IDDM  Vancomycin/ Gentamycin Preop  . ESOPHAGOGASTRODUODENOSCOPY (EGD) WITH PROPOFOL N/A 05/22/2015   Procedure: ESOPHAGOGASTRODUODENOSCOPY (EGD) WITH PROPOFOL;  Surgeon: Manya Silvas, MD;  Location: [facility] ENDOSCOPY;  Service: Endoscopy;  Laterality: N/A;  . HERNIA REPAIR    . REPLACEMENT TOTAL KNEE      Prior to Admission medications   Medication Sig Start Date End Date Taking? Authorizing Provider  aspirin EC 81 MG tablet Take 81 mg by mouth daily. Reported on 05/15/2015   Yes [provider]  loratadine (CLARITIN) 10 MG tablet Take 10 mg by mouth daily.   Yes [provider]  losartan (COZAAR) 25 MG tablet Take 25 mg by mouth daily.   Yes [provider]  metFORMIN (GLUCOPHAGE-XR) 500 MG 24 hr tablet Take 500 mg by mouth 3 (three) times daily.    Yes [provider]  pravastatin (PRAVACHOL) 20 MG tablet Take 20 mg by mouth at bedtime.   Yes [provider]  clindamycin (CLEOCIN) 300 MG capsule Take 1 capsule (300 mg total) by mouth 3 (three) times daily. Patient not taking: Reported on 12/31/2016 02/10/15   Loney Hering, MD  ELIQUIS STARTER PACK [facility] STARTER PACK) 5 MG TABS Take as directed on package: start with two-5mg  tablets twice daily for 7 days. On day 8, switch to one-5mg  tablet twice daily. 12/31/16   Darel Hong, MD  HYDROcodone-acetaminophen (NORCO/VICODIN) 5-325 MG tablet Take 1 tablet by mouth  every 4 (four) hours as needed for moderate pain. Patient not taking: Reported on 12/31/2016 02/10/15   Loney Hering, MD  metoprolol tartrate (LOPRESSOR) 50 MG tablet Take 1 tablet (50 mg total) by mouth 2 (two) times daily. 12/31/16 12/31/17  Darel Hong, MD    Allergies Amoxicillin; Iodinated diagnostic agents; Lipitor [atorvastatin]; and Plavix [clopidogrel]  No family  history on file.  Social History Social History  Substance Use Topics  . Smoking status: Never Smoker  . Smokeless tobacco: Not on file  . Alcohol use No     Comment: occasional    Review of Systems Constitutional: No fever/chills Eyes: No visual changes. ENT: No sore throat. Cardiovascular: positive chest pain. Respiratory: positive shortness of breath. Gastrointestinal: No abdominal pain.  No nausea, no vomiting.  No diarrhea.  No constipation. Genitourinary: Negative for dysuria. Musculoskeletal: Negative for back pain. Skin: Negative for rash. Neurological: Negative for headaches, focal weakness or numbness.   ____________________________________________   PHYSICAL EXAM:  VITAL SIGNS: ED Triage Vitals  Enc Vitals Group     BP 12/31/16 1150 102/71     Pulse Rate 12/31/16 1150 (!) 137     Resp 12/31/16 1150 15     Temp 12/31/16 1150 97.6 F (36.4 C)     Temp Source 12/31/16 1150 Oral     SpO2 12/31/16 1150 97 %     Weight 12/31/16 1151 210 lb (95.3 kg)     Height 12/31/16 1151 6\' 2"  (1.88 m)     Head Circumference --      Peak Flow --      Pain Score 12/31/16 1150 6     Pain Loc --      Pain Edu? --      Excl. in [place]? --     Constitutional: alert and oriented 4 joking and laughing well appearing nontoxic no diaphoresis speaks in full clear sentences Eyes: PERRL EOMI. Head: Atraumatic. Nose: No congestion/rhinnorhea. Mouth/Throat: No trismus Neck: No stridor.   Cardiovascular: irregularly irregular and tachycardic no murmurs appreciated Respiratory: Normal respiratory effort.  No retractions. Lungs CTAB and moving good air Gastrointestinal: soft nontender Musculoskeletal: No lower extremity edema   legs are equal in size Neurologic:  Normal speech and language. No gross focal neurologic deficits are appreciated. Skin:  Skin is warm, dry and intact. No rash noted. Psychiatric: Mood and affect are normal. Speech and behavior are  normal.    ____________________________________________   DIFFERENTIAL includes but not limited to  atrial fibrillation with rapid ventricular response, atrial flutter, ventricular tachycardia, acute coronary syndrome, hyperthyroidism, dehydration ____________________________________________   LABS (all labs ordered are listed, but only abnormal results are displayed)  Labs Reviewed  BASIC METABOLIC PANEL - Abnormal; Notable for the following:       Result Value   Glucose, Bld 281 (*)    All other components within normal limits  HEPATIC FUNCTION PANEL - Abnormal; Notable for the following:    Total Bilirubin 1.5 (*)    Indirect Bilirubin 1.4 (*)    All other components within normal limits  BRAIN NATRIURETIC PEPTIDE - Abnormal; Notable for the following:    B Natriuretic Peptide 388.0 (*)    All other components within normal limits  TSH - Abnormal; Notable for the following:    TSH 6.683 (*)    All other components within normal limits  TROPONIN I    blood work interpreted by me shows high normal TSH. He has a slightly elevated beta natruretic peptide  although no signs of acute ischemia __________________________________________  EKG   ____ ED ECG REPORT I, Darel Hong, the attending physician, personally viewed and interpreted this ECG.  Date: 12/31/2016 EKG Time: 1144 Rate: 141 Rhythm: Atrial fibrillation with rapid ventricular response QRS Axis: normal Intervals: normal ST/T Wave abnormalities: Rate related ST changes Narrative Interpretation: Initial fibrillation with rapid ventricular response with rate related ST changes no signs of acute ischemia   ED ECG REPORT I, Darel Hong, the attending physician, personally viewed and interpreted this ECG.  Date: 12/31/2016 EKG Time: 1320 Rate: 84 Rhythm: atrial fibrillation with rate controlled QRS Axis: normal Intervals: normal ST/T Wave abnormalities: normal Narrative Interpretation: no evidence of  acute ischemia   ________________________________________  RADIOLOGY  Chest x-ray reviewed by me no acute disease ____________________________________________   PROCEDURES  Procedure(s) performed: no  Procedures  Critical Care performed: yes  CRITICAL CARE Performed by: Darel Hong   Total critical care time: 35 minutes  Critical care time was exclusive of separately billable procedures and treating other patients.  Critical care was necessary to treat or prevent imminent or life-threatening deterioration.  Critical care was time spent personally by me on the following activities: development of treatment plan with patient and/or surrogate as well as nursing, discussions with consultants, evaluation of patient's response to treatment, examination of patient, obtaining history from patient or surrogate, ordering and performing treatments and interventions, ordering and review of laboratory studies, ordering and review of radiographic studies, pulse oximetry and re-evaluation of patient's condition.   Observation: no ____________________________________________   INITIAL IMPRESSION / ASSESSMENT AND PLAN / ED COURSE  Pertinent labs & imaging results that were available during my care of the patient were reviewed by me and considered in my medical decision making (see chart for details).  On arrival the patient is tachycardic irregularly irregular to 130s 140s with borderline low blood pressure. This arrhythmia is new. It is acute onset less than 24 hours which would make the patient a potential candidate for emergency room cardioversion. No discussed the case with on-call cardiologist prior to initiation of any agents aside from magnesium.     ----------------------------------------- 12:20 PM on 12/31/2016 -----------------------------------------  I discussed the case with Dr. Nehemiah Massed on call for the patient's cardiologist Dr. Clayborn Bigness who recommends against  cardioversion and he recommends initiation of metoprolol 50 mg orally now. ____________________________________________  ----------------------------------------- 1:15 PM on 12/31/2016 -----------------------------------------  The patient's heart rate is now down into the 36s and 80sAnd he feels markedly improved. I discussed the case once again with Dr. Nehemiah Massed who recommends initiation of metoprolol 50 mg twice a day as well as Eliquis and follow up this coming Monday.  The patient verbalizes understanding and agreement with the plan.  FINAL CLINICAL IMPRESSION(S) / ED DIAGNOSES  Final diagnoses:  Atrial fibrillation with RVR ([place])      NEW MEDICATIONS STARTED DURING THIS VISIT:  Discharge Medication List as of 12/31/2016  2:34 PM    START taking these medications   Details  ELIQUIS STARTER PACK (ELIQUIS STARTER PACK) 5 MG TABS Take as directed on package: start with two-5mg  tablets twice daily for 7 days. On day 8, switch to one-5mg  tablet twice daily., Print    metoprolol tartrate (LOPRESSOR) 50 MG tablet Take 1 tablet (50 mg total) by mouth 2 (two) times daily., Starting Fri 12/31/2016, Until Sat 12/31/2017, Print         Note:  This document was prepared using Dragon voice recognition software and may include unintentional dictation  errors.     Darel Hong, MD 01/01/17 1415

## 2017-06-09 ENCOUNTER — Observation Stay
Admission: EM | Admit: 2017-06-09 | Discharge: 2017-06-11 | Disposition: A | Discharge status: Home / Self Care | Payer: Federal, State, Local not specified - PPO | Attending: Internal Medicine | Admitting: Internal Medicine

## 2017-06-09 DIAGNOSIS — Z7982 Long term (current) use of aspirin: Secondary | ICD-10-CM | POA: Insufficient documentation

## 2017-06-09 DIAGNOSIS — Z881 Allergy status to other antibiotic agents status: Secondary | ICD-10-CM | POA: Insufficient documentation

## 2017-06-09 DIAGNOSIS — R001 Bradycardia, unspecified: Secondary | ICD-10-CM | POA: Diagnosis not present

## 2017-06-09 DIAGNOSIS — R197 Diarrhea, unspecified: Secondary | ICD-10-CM | POA: Insufficient documentation

## 2017-06-09 DIAGNOSIS — R079 Chest pain, unspecified: Secondary | ICD-10-CM

## 2017-06-09 DIAGNOSIS — Z888 Allergy status to other drugs, medicaments and biological substances status: Secondary | ICD-10-CM | POA: Diagnosis not present

## 2017-06-09 DIAGNOSIS — Z91041 Radiographic dye allergy status: Secondary | ICD-10-CM | POA: Diagnosis not present

## 2017-06-09 DIAGNOSIS — G43909 Migraine, unspecified, not intractable, without status migrainosus: Secondary | ICD-10-CM | POA: Diagnosis not present

## 2017-06-09 DIAGNOSIS — I10 Essential (primary) hypertension: Secondary | ICD-10-CM | POA: Insufficient documentation

## 2017-06-09 DIAGNOSIS — Z7984 Long term (current) use of oral hypoglycemic drugs: Secondary | ICD-10-CM | POA: Diagnosis not present

## 2017-06-09 DIAGNOSIS — E119 Type 2 diabetes mellitus without complications: Secondary | ICD-10-CM | POA: Diagnosis not present

## 2017-06-09 DIAGNOSIS — E785 Hyperlipidemia, unspecified: Secondary | ICD-10-CM | POA: Insufficient documentation

## 2017-06-09 DIAGNOSIS — Z96659 Presence of unspecified artificial knee joint: Secondary | ICD-10-CM | POA: Diagnosis not present

## 2017-06-09 DIAGNOSIS — N400 Enlarged prostate without lower urinary tract symptoms: Secondary | ICD-10-CM | POA: Insufficient documentation

## 2017-06-09 DIAGNOSIS — R0789 Other chest pain: Secondary | ICD-10-CM | POA: Diagnosis not present

## 2017-06-09 DIAGNOSIS — I2510 Atherosclerotic heart disease of native coronary artery without angina pectoris: Secondary | ICD-10-CM | POA: Insufficient documentation

## 2017-06-09 DIAGNOSIS — Z955 Presence of coronary angioplasty implant and graft: Secondary | ICD-10-CM | POA: Diagnosis not present

## 2017-06-09 DIAGNOSIS — I480 Paroxysmal atrial fibrillation: Secondary | ICD-10-CM | POA: Diagnosis not present

## 2017-06-09 DIAGNOSIS — Z79899 Other long term (current) drug therapy: Secondary | ICD-10-CM | POA: Insufficient documentation

## 2017-06-09 DIAGNOSIS — I252 Old myocardial infarction: Secondary | ICD-10-CM | POA: Insufficient documentation

## 2017-06-09 DIAGNOSIS — K219 Gastro-esophageal reflux disease without esophagitis: Secondary | ICD-10-CM | POA: Diagnosis not present

## 2017-06-09 DIAGNOSIS — Z7901 Long term (current) use of anticoagulants: Secondary | ICD-10-CM | POA: Diagnosis not present

## 2017-06-10 ENCOUNTER — Other Ambulatory Visit: Payer: Self-pay

## 2017-06-10 ENCOUNTER — Emergency Department: Payer: Federal, State, Local not specified - PPO

## 2017-06-10 DIAGNOSIS — R079 Chest pain, unspecified: Secondary | ICD-10-CM | POA: Diagnosis present

## 2017-06-10 LAB — GASTROINTESTINAL PANEL BY PCR, STOOL (REPLACES STOOL CULTURE)
Adenovirus F40/41: NOT DETECTED
Astrovirus: NOT DETECTED
CAMPYLOBACTER SPECIES: NOT DETECTED
CRYPTOSPORIDIUM: NOT DETECTED
Cyclospora cayetanensis: NOT DETECTED
ENTEROPATHOGENIC E COLI (EPEC): NOT DETECTED
Entamoeba histolytica: NOT DETECTED
Enteroaggregative E coli (EAEC): NOT DETECTED
Enterotoxigenic E coli (ETEC): NOT DETECTED
GIARDIA LAMBLIA: NOT DETECTED
NOROVIRUS GI/GII: NOT DETECTED
PLESIMONAS SHIGELLOIDES: NOT DETECTED
ROTAVIRUS A: NOT DETECTED
SALMONELLA SPECIES: NOT DETECTED
SHIGA LIKE TOXIN PRODUCING E COLI (STEC): NOT DETECTED
SHIGELLA/ENTEROINVASIVE E COLI (EIEC): NOT DETECTED
Sapovirus (I, II, IV, and V): NOT DETECTED
Vibrio cholerae: NOT DETECTED
Vibrio species: NOT DETECTED
Yersinia enterocolitica: NOT DETECTED

## 2017-06-10 LAB — CBC
HCT: 35.3 % — ABNORMAL LOW (ref 40.0–52.0)
HEMOGLOBIN: 12 g/dL — AB (ref 13.0–18.0)
MCH: 33.3 pg (ref 26.0–34.0)
MCHC: 34.2 g/dL (ref 32.0–36.0)
MCV: 97.6 fL (ref 80.0–100.0)
Platelets: 211 10*3/uL (ref 150–440)
RBC: 3.61 MIL/uL — AB (ref 4.40–5.90)
RDW: 13.8 % (ref 11.5–14.5)
WBC: 5 10*3/uL (ref 3.8–10.6)

## 2017-06-10 LAB — BASIC METABOLIC PANEL
ANION GAP: 8 (ref 5–15)
BUN: 16 mg/dL (ref 6–20)
CALCIUM: 9.8 mg/dL (ref 8.9–10.3)
CO2: 27 mmol/L (ref 22–32)
Chloride: 105 mmol/L (ref 101–111)
Creatinine, Ser: 0.74 mg/dL (ref 0.61–1.24)
GFR calc Af Amer: >60 mL/min (ref >60)
GFR calc non Af Amer: >60 mL/min (ref >60)
Glucose, Bld: 225 mg/dL — ABNORMAL HIGH (ref 65–99)
Potassium: 4 mmol/L (ref 3.5–5.1)
Sodium: 140 mmol/L (ref 135–145)

## 2017-06-10 LAB — C DIFFICILE QUICK SCREEN W PCR REFLEX
C DIFFICILE (CDIFF) TOXIN: NEGATIVE
C DIFFICLE (CDIFF) ANTIGEN: NEGATIVE
C Diff interpretation: NOT DETECTED

## 2017-06-10 LAB — GLUCOSE, CAPILLARY
GLUCOSE-CAPILLARY: 224 mg/dL — AB (ref 65–99)
GLUCOSE-CAPILLARY: 175 mg/dL — AB (ref 65–99)

## 2017-06-10 LAB — TROPONIN I: Troponin I: <0.03 ng/mL (ref <0.03)

## 2017-06-10 LAB — FIBRIN DERIVATIVES D-DIMER (ARMC ONLY): FIBRIN DERIVATIVES D-DIMER (ARMC): 389.12 ng{FEU}/mL (ref 0.00–499.00)

## 2017-06-10 MED ORDER — BARIUM SULFATE 2.1 % PO SUSP
450.0000 mL | ORAL | Status: AC
  Administered 2017-06-10 (×2): 450 mL via ORAL

## 2017-06-10 MED ORDER — METOPROLOL TARTRATE 25 MG PO TABS
25.0000 mg | ORAL_TABLET | Freq: Two times a day (BID) | ORAL | Status: DC
  Administered 2017-06-10 (×2): 25 mg via ORAL
  Filled 2017-06-10 (×2): qty 1

## 2017-06-10 MED ORDER — POLYETHYLENE GLYCOL 3350 17 G PO PACK: 17.0000 g | PACK | Freq: Every day | ORAL | Status: DC | PRN

## 2017-06-10 MED ORDER — ASPIRIN EC 81 MG PO TBEC
DELAYED_RELEASE_TABLET | ORAL | Status: AC
  Administered 2017-06-10: 81 mg via ORAL
  Filled 2017-06-10: qty 1

## 2017-06-10 MED ORDER — NITROGLYCERIN 0.4 MG SL SUBL
0.4000 mg | SUBLINGUAL_TABLET | SUBLINGUAL | Status: DC | PRN
Start: 2017-06-10 — End: 2017-06-11

## 2017-06-10 MED ORDER — ASPIRIN EC 81 MG PO TBEC
81.0000 mg | DELAYED_RELEASE_TABLET | Freq: Every day | ORAL | Status: DC
  Administered 2017-06-10 – 2017-06-11 (×2): 81 mg via ORAL
  Filled 2017-06-10: qty 1

## 2017-06-10 MED ORDER — ACETAMINOPHEN 325 MG PO TABS
650.0000 mg | ORAL_TABLET | Freq: Once | ORAL | Status: AC
  Administered 2017-06-10: 650 mg via ORAL

## 2017-06-10 MED ORDER — LORATADINE 10 MG PO TABS
10.0000 mg | ORAL_TABLET | Freq: Every day | ORAL | Status: DC
  Administered 2017-06-10 – 2017-06-11 (×2): 10 mg via ORAL
  Filled 2017-06-10: qty 1

## 2017-06-10 MED ORDER — LOSARTAN POTASSIUM 25 MG PO TABS
25.0000 mg | ORAL_TABLET | Freq: Every day | ORAL | Status: DC
  Administered 2017-06-10 – 2017-06-11 (×2): 25 mg via ORAL
  Filled 2017-06-10: qty 1

## 2017-06-10 MED ORDER — APIXABAN 5 MG PO TABS
5.0000 mg | ORAL_TABLET | Freq: Two times a day (BID) | ORAL | Status: DC
  Administered 2017-06-10 – 2017-06-11 (×3): 5 mg via ORAL
  Filled 2017-06-10 (×2): qty 1

## 2017-06-10 MED ORDER — METOPROLOL TARTRATE 25 MG PO TABS
ORAL_TABLET | ORAL | Status: AC
  Administered 2017-06-10: 25 mg via ORAL
  Filled 2017-06-10: qty 1

## 2017-06-10 MED ORDER — ONDANSETRON HCL 4 MG PO TABS: 4.0000 mg | ORAL_TABLET | Freq: Four times a day (QID) | ORAL | Status: DC | PRN

## 2017-06-10 MED ORDER — ASPIRIN 81 MG PO CHEW
324.0000 mg | CHEWABLE_TABLET | Freq: Once | ORAL | Status: DC
  Filled 2017-06-10: qty 4

## 2017-06-10 MED ORDER — HYDROCODONE-ACETAMINOPHEN 5-325 MG PO TABS: 1.0000 | ORAL_TABLET | ORAL | Status: DC | PRN

## 2017-06-10 MED ORDER — PRAVASTATIN SODIUM 20 MG PO TABS
20.0000 mg | ORAL_TABLET | Freq: Every day | ORAL | Status: DC
  Administered 2017-06-10: 20 mg via ORAL
  Filled 2017-06-10: qty 1

## 2017-06-10 MED ORDER — LOSARTAN POTASSIUM 50 MG PO TABS
ORAL_TABLET | ORAL | Status: AC
  Administered 2017-06-10: 25 mg via ORAL
  Filled 2017-06-10: qty 1

## 2017-06-10 MED ORDER — LORATADINE 10 MG PO TABS
ORAL_TABLET | ORAL | Status: AC
  Administered 2017-06-10: 10 mg via ORAL
  Filled 2017-06-10: qty 1

## 2017-06-10 MED ORDER — INSULIN ASPART 100 UNIT/ML ~~LOC~~ SOLN
0.0000 [IU] | Freq: Three times a day (TID) | SUBCUTANEOUS | Status: DC
  Administered 2017-06-10: 3 [IU] via SUBCUTANEOUS
  Administered 2017-06-11: 2 [IU] via SUBCUTANEOUS
  Filled 2017-06-10 (×2): qty 1

## 2017-06-10 MED ORDER — ACETAMINOPHEN 325 MG PO TABS
650.0000 mg | ORAL_TABLET | Freq: Four times a day (QID) | ORAL | Status: DC | PRN
  Administered 2017-06-11: 650 mg via ORAL
  Filled 2017-06-10: qty 2

## 2017-06-10 MED ORDER — BISACODYL 5 MG PO TBEC: 5.0000 mg | DELAYED_RELEASE_TABLET | Freq: Every day | ORAL | Status: DC | PRN

## 2017-06-10 MED ORDER — ACETAMINOPHEN 650 MG RE SUPP: 650.0000 mg | Freq: Four times a day (QID) | RECTAL | Status: DC | PRN

## 2017-06-10 MED ORDER — ONDANSETRON HCL 4 MG/2ML IJ SOLN
4.0000 mg | Freq: Four times a day (QID) | INTRAMUSCULAR | Status: DC | PRN
Start: 2017-06-10 — End: 2017-06-11

## 2017-06-10 MED ORDER — ACETAMINOPHEN 325 MG PO TABS
ORAL_TABLET | ORAL | Status: AC
  Filled 2017-06-10: qty 2

## 2017-06-10 MED ORDER — APIXABAN 5 MG PO TABS
ORAL_TABLET | ORAL | Status: AC
  Administered 2017-06-10: 5 mg via ORAL
  Filled 2017-06-10: qty 1

## 2017-06-10 NOTE — ED Provider Notes (Signed)
[facility] Emergency Department Provider Note _____   First MD Initiated Contact with Patient 06/10/17 0009     (approximate)  I have reviewed the triage vital signs and the nursing notes.   HISTORY  Chief Complaint Chest Pain    HPI ELVIN MCCARTIN is a 77 y.o. male with below list of chronic medical conditions including previous myocardial infarction presents to the emergency department 2-day history of right-sided chest pain which patient describes as pressure.  Patient states his current pain score 6 out of 10.  Patient denies any aggravating or alleviating factors for the pain.  Patient does not admit to any dyspnea or diaphoresis.  Patient does however admit to abdominal discomfort and diarrhea which she states that he has been having diarrhea for "months".  Patient noted to be markedly hypertensive on EMS arrival with systolic of 989 as such Nitropaste was applied BP 196/90 on arrival to the emergency department.   Past Medical History:  Diagnosis Date  . BPH (benign prostatic hyperplasia)   . Diabetes mellitus without complication ([place])   . GERD (gastroesophageal reflux disease)   . Headache    migraines  . Hyperlipidemia   . Hypertension   . MI (myocardial infarction) ([place])     There are no active problems to display for this patient.   Past Surgical History:  Procedure Laterality Date  . BACK SURGERY    . CARDIAC CATHETERIZATION Left 05/15/2015   Procedure: Left Heart Cath and Coronary Angiography;  Surgeon: Yolonda Kida, MD;  Location: [place] CV LAB;  Service: Cardiovascular;  Laterality: Left;  . CHOLECYSTECTOMY    . COLONOSCOPY N/A 03/10/2015   Procedure: COLONOSCOPY;  Surgeon: Manya Silvas, MD;  Location: [facility] ENDOSCOPY;  Service: Endoscopy;  Laterality: N/A;   IDDM  Vancomycin/ Gentamycin Preop  . ESOPHAGOGASTRODUODENOSCOPY (EGD) WITH PROPOFOL N/A 05/22/2015   Procedure: ESOPHAGOGASTRODUODENOSCOPY (EGD) WITH PROPOFOL;   Surgeon: Manya Silvas, MD;  Location: [facility] ENDOSCOPY;  Service: Endoscopy;  Laterality: N/A;  . HERNIA REPAIR    . REPLACEMENT TOTAL KNEE      Prior to Admission medications   Medication Sig Start Date End Date Taking? Authorizing Provider  aspirin EC 81 MG tablet Take 81 mg by mouth daily. Reported on 05/15/2015    [provider]  clindamycin (CLEOCIN) 300 MG capsule Take 1 capsule (300 mg total) by mouth 3 (three) times daily. Patient not taking: Reported on 12/31/2016 02/10/15   Loney Hering, MD  ELIQUIS STARTER PACK [facility] STARTER PACK) 5 MG TABS Take as directed on package: start with two-5mg  tablets twice daily for 7 days. On day 8, switch to one-5mg  tablet twice daily. 12/31/16   Darel Hong, MD  HYDROcodone-acetaminophen (NORCO/VICODIN) 5-325 MG tablet Take 1 tablet by mouth every 4 (four) hours as needed for moderate pain. Patient not taking: Reported on 12/31/2016 02/10/15   Loney Hering, MD  loratadine (CLARITIN) 10 MG tablet Take 10 mg by mouth daily.    [provider]  losartan (COZAAR) 25 MG tablet Take 25 mg by mouth daily.    [provider]  metFORMIN (GLUCOPHAGE-XR) 500 MG 24 hr tablet Take 500 mg by mouth 3 (three) times daily.     [provider]  metoprolol tartrate (LOPRESSOR) 50 MG tablet Take 1 tablet (50 mg total) by mouth 2 (two) times daily. 12/31/16 12/31/17  Darel Hong, MD  pravastatin (PRAVACHOL) 20 MG tablet Take 20 mg by mouth at bedtime.  [provider]    Allergies Amoxicillin; Iodinated diagnostic agents; Lipitor [atorvastatin]; and Plavix [clopidogrel]  No family history on file.  Social History Social History   Tobacco Use  . Smoking status: Never Smoker  Substance Use Topics  . Alcohol use: No    Comment: occasional  . Drug use: No    Review of Systems Constitutional: No fever/chills Eyes: No visual changes. ENT: No sore throat. Cardiovascular: Positive for chest  pain. Respiratory: Denies shortness of breath. Gastrointestinal: Generalized tenderness to palpation, nausea vomiting and diarrhea. Genitourinary: Negative for dysuria. Musculoskeletal: Negative for neck pain.  Negative for back pain. Integumentary: Negative for rash. Neurological: Negative for headaches, focal weakness or numbness.   ____________________________________________   PHYSICAL EXAM:  VITAL SIGNS: ED Triage Vitals  Enc Vitals Group     BP 06/10/17 0007 (!) 196/90     Pulse Rate 06/10/17 0007 (!) 51     Resp 06/10/17 0007 13     Temp 06/10/17 0007 98.1 F (36.7 C)     Temp Source 06/10/17 0007 Oral     SpO2 06/10/17 0007 95 %     Weight 06/10/17 0008 93 kg (205 lb)     Height 06/10/17 0008 1.88 m (6\' 2" )     Head Circumference --      Peak Flow --      Pain Score 06/10/17 0008 6     Pain Loc --      Pain Edu? --      Excl. in [place]? --     Constitutional: Alert and oriented. Well appearing and in no acute distress. Eyes: Conjunctivae are normal.  Head: Atraumatic. Mouth/Throat: Mucous membranes are moist. Oropharynx non-erythematous. Neck: No stridor.  Cardiovascular: Normal rate, regular rhythm. Good peripheral circulation. Grossly normal heart sounds. Respiratory: Normal respiratory effort.  No retractions. Lungs CTAB. Gastrointestinal: Soft and nontender. No distention.  Musculoskeletal: No lower extremity tenderness nor edema. No gross deformities of extremities. Neurologic:  Normal speech and language. No gross focal neurologic deficits are appreciated.  Skin:  Skin is warm, dry and intact. No rash noted. Psychiatric: Mood and affect are normal. Speech and behavior are normal.  ____________________________________________   LABS (all labs ordered are listed, but only abnormal results are displayed)  Labs Reviewed  BASIC METABOLIC PANEL - Abnormal; Notable for the following components:      Result Value   Glucose, Bld 225 (*)    All other components  within normal limits  CBC - Abnormal; Notable for the following components:   RBC 3.61 (*)    Hemoglobin 12.0 (*)    HCT 35.3 (*)    All other components within normal limits  TROPONIN I  FIBRIN DERIVATIVES D-DIMER ([facility] ONLY)   ____________________________________________  EKG  ED ECG REPORT I, [place] Tammy Wickliffe, the attending physician, personally viewed and interpreted this ECG.   Date: 06/10/2017  EKG Time: 12:11 AM  Rate: 51  Rhythm: Sinus bradycardia  Axis: Normal  Intervals: Normal  ST&T Change: None  ____________________________________________  RADIOLOGY I, [place] Heideman, personally viewed and evaluated these images (plain radiographs) as part of my medical decision making, as well as reviewing the written report by the radiologist.  ED MD interpretation: No acute cardiopulmonary findings per radiologist  Official radiology report(s): Dg Chest Port 1 View  Result Date: 06/10/2017 CLINICAL DATA:  77 year old male with chest pain. EXAM: PORTABLE CHEST 1 VIEW COMPARISON:  Chest radiograph dated 12/31/2016 FINDINGS: The lungs are clear. There is no  pleural effusion or pneumothorax. The cardiac silhouette is within normal limits. Old healed right posterior rib fractures. No acute osseous pathology. IMPRESSION: No active disease. Electronically Signed   By: Anner Crete M.D.   On: 06/10/2017 00:26    Procedures   ____________________________________________   INITIAL IMPRESSION / ASSESSMENT AND PLAN / ED COURSE  As part of my medical decision making, I reviewed the following data within the electronic MEDICAL RECORD NUMBER   77 year old male presented with above-stated history of central chest pain radiating to the right side of the chest.  Consider to possibly of CAD versus demand ischemia given market hypertension and as such EKG was obtained which revealed no evidence of ischemia or infarction.  Laboratory data including troponin x2-.  Consider to possibly  pulmonary emboli and as such d-dimer obtained which was normal.  Given patient's generalized abdominal discomfort on palpation CT scan of the abdomen performed which revealed no acute intra-abdominal pathology.  Patient with ongoing chest pain such patient discussed with Dr. Wanita Chamberlain for hospital admission for ongoing chest pain possible hypertensive emergency     ____________________________________________  FINAL CLINICAL IMPRESSION(S) / ED DIAGNOSES  Final diagnoses:  Chest pain, unspecified type     MEDICATIONS GIVEN DURING THIS VISIT:  Medications  aspirin chewable tablet 324 mg (324 mg Oral Not Given 06/10/17 0050)     ED Discharge Orders    None       Note:  This document was prepared using Dragon voice recognition software and may include unintentional dictation errors.    Gregor Hams, MD 06/10/17 [phone]

## 2017-06-10 NOTE — H&P (Signed)
[place] [facility] [place] NAME: Joseph Mann    MR#:  [id number]  DATE OF BIRTH:  28-May-1940  DATE OF ADMISSION:  06/09/2017  PRIMARY CARE PHYSICIAN: Kirk Ruths, MD   REQUESTING/REFERRING PHYSICIAN: Dr. Owens Shark CHIEF COMPLAINT:   Chest pain HISTORY OF PRESENT ILLNESS:  Joseph Mann  is a 77 y.o. male with a known history of CAD, essential hypertension and hyperlipidemia who presents today with chest pain. Patient says for the past 2 days he has had right-sided chest pain. At its worst as a 10 out of 10. It was relieved with nitroglycerin which she received in the emergency room. He is currently chest pain-free. He states that the chest pain did not radiate. There is no exacerbating factors. The chest pain lasted anywhere between 5-30 minutes. He is also complaining of diarrhea for several months. Initially was thought to be due to metformin and now he has decreased dose of metformin however continues to have diarrhea. He has 2-3 loose stools a day. He has had no abdominal pain. He was on antibiotic recently for sinus infection however reports that the diarrhea was prior to this. He denies fever, chills, lower extremity edema, PND or orthopnea. His initial blood pressure systolically was in the 496P. Currently blood pressure is within normal limits.  PAST MEDICAL HISTORY:   Past Medical History:  Diagnosis Date  . BPH (benign prostatic hyperplasia)   . Diabetes mellitus without complication ([place])   . GERD (gastroesophageal reflux disease)   . Headache    migraines  . Hyperlipidemia   . Hypertension   . MI (myocardial infarction) ([place])     PAST SURGICAL HISTORY:   Past Surgical History:  Procedure Laterality Date  . BACK SURGERY    . CARDIAC CATHETERIZATION Left 05/15/2015   Procedure: Left Heart Cath and Coronary Angiography;  Surgeon: Yolonda Kida, MD;  Location: [place] CV LAB;  Service: Cardiovascular;  Laterality: Left;  .  CHOLECYSTECTOMY    . COLONOSCOPY N/A 03/10/2015   Procedure: COLONOSCOPY;  Surgeon: Manya Silvas, MD;  Location: [facility] ENDOSCOPY;  Service: Endoscopy;  Laterality: N/A;   IDDM  Vancomycin/ Gentamycin Preop  . ESOPHAGOGASTRODUODENOSCOPY (EGD) WITH PROPOFOL N/A 05/22/2015   Procedure: ESOPHAGOGASTRODUODENOSCOPY (EGD) WITH PROPOFOL;  Surgeon: Manya Silvas, MD;  Location: [facility] ENDOSCOPY;  Service: Endoscopy;  Laterality: N/A;  . HERNIA REPAIR    . REPLACEMENT TOTAL KNEE      SOCIAL HISTORY:   Social History   Tobacco Use  . Smoking status: Never Smoker  Substance Use Topics  . Alcohol use: No    Comment: occasional    FAMILY HISTORY:   Hypertension CAD DRUG ALLERGIES:   Allergies  Allergen Reactions  . Amoxicillin Hives  . Iodinated Diagnostic Agents Other (See Comments)  . Lipitor [Atorvastatin]   . Plavix [Clopidogrel] Hives    REVIEW OF SYSTEMS:   Review of Systems  Constitutional: Negative.  Negative for chills, fever and malaise/fatigue.  HENT: Negative.  Negative for ear discharge, ear pain, hearing loss, nosebleeds and sore throat.   Eyes: Negative.  Negative for blurred vision and pain.  Respiratory: Negative.  Negative for cough, hemoptysis, shortness of breath and wheezing.   Cardiovascular: Positive for chest pain. Negative for palpitations and leg swelling.  Gastrointestinal: Positive for diarrhea. Negative for abdominal pain, blood in stool, nausea and vomiting.  Genitourinary: Negative.  Negative for dysuria.  Musculoskeletal: Negative.  Negative for back pain.  Skin: Negative.  Neurological: Negative for dizziness, tremors, speech change, focal weakness, seizures and headaches.  Endo/Heme/Allergies: Negative.  Does not bruise/bleed easily.  Psychiatric/Behavioral: Negative.  Negative for depression, hallucinations and suicidal ideas.    MEDICATIONS AT HOME:   Prior to Admission medications   Medication Sig Start Date End Date Taking? Authorizing  Provider  apixaban (ELIQUIS) 5 MG TABS tablet Take 5 mg by mouth 2 (two) times daily.   Yes [provider]  metFORMIN (GLUCOPHAGE-XR) 500 MG 24 hr tablet Take 500 mg by mouth daily with breakfast.    Yes [provider]  metoprolol tartrate (LOPRESSOR) 50 MG tablet Take 1 tablet (50 mg total) by mouth 2 (two) times daily. Patient taking differently: Take 25 mg by mouth 2 (two) times daily.  12/31/16 12/31/17 Yes Darel Hong, MD  pravastatin (PRAVACHOL) 20 MG tablet Take 20 mg by mouth at bedtime.   Yes [provider]  aspirin EC 81 MG tablet Take 81 mg by mouth daily. Reported on 05/15/2015    [provider]  clindamycin (CLEOCIN) 300 MG capsule Take 1 capsule (300 mg total) by mouth 3 (three) times daily. Patient not taking: Reported on 12/31/2016 02/10/15   Loney Hering, MD  ELIQUIS STARTER PACK [facility] STARTER PACK) 5 MG TABS Take as directed on package: start with two-5mg  tablets twice daily for 7 days. On day 8, switch to one-5mg  tablet twice daily. Patient not taking: Reported on 06/10/2017 12/31/16   Darel Hong, MD  HYDROcodone-acetaminophen (NORCO/VICODIN) 5-325 MG tablet Take 1 tablet by mouth every 4 (four) hours as needed for moderate pain. Patient not taking: Reported on 12/31/2016 02/10/15   Loney Hering, MD  loratadine (CLARITIN) 10 MG tablet Take 10 mg by mouth daily.    [provider]  losartan (COZAAR) 25 MG tablet Take 25 mg by mouth daily.    [provider]      VITAL SIGNS:  Blood pressure 118/75, pulse (!) 58, temperature 98.1 F (36.7 C), temperature source Oral, resp. rate 11, height 6\' 2"  (1.88 m), weight 93 kg (205 lb), SpO2 91 %.  PHYSICAL EXAMINATION:   Physical Exam  Constitutional: He is oriented to person, place, and time and well-developed, well-nourished, and in no distress. No distress.  HENT:  Head: Normocephalic.  Eyes: No scleral icterus.  Neck: Normal range of motion. Neck supple.  No JVD present. No tracheal deviation present.  Cardiovascular: Normal rate, regular rhythm and normal heart sounds. Exam reveals no gallop and no friction rub.  No murmur heard. He has right-sided chest pain to palpation.  Pulmonary/Chest: Effort normal and breath sounds normal. No respiratory distress. He has no wheezes. He has no rales. He exhibits no tenderness.  Abdominal: Soft. Bowel sounds are normal. He exhibits no distension and no mass. There is no tenderness. There is no rebound and no guarding.  Musculoskeletal: Normal range of motion. He exhibits no edema.  Neurological: He is alert and oriented to person, place, and time.  Skin: Skin is warm. No rash noted. No erythema.  Psychiatric: Affect and judgment normal.      LABORATORY PANEL:   CBC Recent Labs  Lab 06/10/17 0006  WBC 5.0  HGB 12.0*  HCT 35.3*  PLT 211   ------------------------------------------------------------------------------------------------------------------  Chemistries  Recent Labs  Lab 06/10/17 0006  NA 140  K 4.0  CL 105  CO2 27  GLUCOSE 225*  BUN 16  CREATININE 0.74  CALCIUM 9.8   ------------------------------------------------------------------------------------------------------------------  Cardiac Enzymes Recent Labs  Lab 06/10/17 0324  TROPONINI <0.03   ------------------------------------------------------------------------------------------------------------------  RADIOLOGY:  Ct Abdomen Pelvis Wo Contrast  Result Date: 06/10/2017 CLINICAL DATA:  77 year old male with abdominal pain. Concern for acute diverticulitis. EXAM: CT ABDOMEN AND PELVIS WITHOUT CONTRAST TECHNIQUE: Multidetector CT imaging of the abdomen and pelvis was performed following the standard protocol without IV contrast. COMPARISON:  CT dated 07/13/2012 and abdominal ultrasound dated 07/13/2012 FINDINGS: Evaluation of this exam is limited in the absence of intravenous contrast. Lower chest: Minimal  bibasilar atelectatic changes. Multi vessel coronary vascular calcification. No intra-abdominal free air or free fluid. Hepatobiliary: Cholecystectomy. The liver is unremarkable. No intrahepatic biliary ductal dilatation. Pancreas: Unremarkable. No pancreatic ductal dilatation or surrounding inflammatory changes. Spleen: Normal in size without focal abnormality. Adrenals/Urinary Tract: The adrenal glands are unremarkable. There is no hydronephrosis or nephrolithiasis on either side. Multiple small bilateral parapelvic cysts. The visualized ureters and urinary bladder appear unremarkable. Stomach/Bowel: There is a 15 mm duodenal diverticulum. There is scattered sigmoid diverticula without active inflammatory changes. No bowel obstruction or active inflammation. A small hiatal hernia. The appendix is normal. Vascular/Lymphatic: Advanced aortoiliac atherosclerotic disease. 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 evaluation of the vasculature is limited in the absence of intravenous contrast. Dilatation of the common iliac arteries bilaterally measure up to 1.7 cm on the left. No portal venous gas. There is no adenopathy. Reproductive: The prostate and seminal vesicles are grossly unremarkable. Other: None Musculoskeletal: Osteopenia with degenerative changes of the spine. Multilevel disc desiccation and vacuum phenomena. No acute osseous pathology. IMPRESSION: 1. No acute intra-abdominal or pelvic pathology. Colonic diverticulosis. No bowel obstruction or active inflammation. Normal appendix. 2. A 3.4 cm infrarenal aortic aneurysm. Recommend followup by ultrasound in 3 years. This recommendation follows ACR consensus guidelines: White Paper of the ACR Incidental Findings Committee II on Vascular Findings. J Am Coll Radiol 2013; 69:[id number] 3.  Aortic Atherosclerosis (ICD10-I70.0). Electronically Signed   By: Anner Crete M.D.   On: 06/10/2017 04:28   Dg Chest Port  1 View  Result Date: 06/10/2017 CLINICAL DATA:  77 year old male with chest pain. EXAM: PORTABLE CHEST 1 VIEW COMPARISON:  Chest radiograph dated 12/31/2016 FINDINGS: The lungs are clear. There is no pleural effusion or pneumothorax. The cardiac silhouette is within normal limits. Old healed right posterior rib fractures. No acute osseous pathology. IMPRESSION: No active disease. Electronically Signed   By: Anner Crete M.D.   On: 06/10/2017 00:26    EKG:   Sinus bradycardia heart rate 51 no ST elevation or depression IMPRESSION AND PLAN:   77 year old male with known CAD, diabetes and essential hypertension who presents with chest pain.  1. Atypical chest pain: Patient will be evaluated for ACS, however more likely felt to be musculoskeletal in nature as he has tenderness to palpation. Monitor on telemetry and trend troponins. If these are negative patient may proceed to nuclear stress test in a.m. Cardiology consultation requested. Continue aspirin, beta blocker and nitroglycerin when necessary Patient had cath in 2017 which did not show major blockage of any vessels.  2. Diabetes: Hold metformin ADA diet and sliding scale initiated  3. Diarrhea: Check stool studies and C. Difficile.  4. Essential hypertension: Continue losartan and metoprolol  5. PAF: Continue anticoagulation with Eliquis and metoprolol for heart rate control     All the records are reviewed and case discussed with ED provider. Management plans discussed with the patient and he is in agreement  CODE STATUS: full  TOTAL TIME TAKING CARE OF THIS PATIENT: 41 minutes.    Montee Tallman M.D on 06/10/2017 at 8:13 AM  Between 7am to 6pm - Pager - [phone]  After 6pm go to [email or web address] - password [email or web address]  Office  [phone]  CC: Primary care physician; Kirk Ruths, MD

## 2017-06-10 NOTE — Plan of Care (Signed)
Pt admitted this shift from ED . A&Ox4. VSS . Ambulatory. NSR on monitor.  No complaints thus far. Will continue to monitor and report to oncoming RN .  Progressing Education: Knowledge of General Education information will improve 06/10/2017 1659 - Progressing by Aleen Campi, RN Health Behavior/Discharge Planning: Ability to manage health-related needs will improve 06/10/2017 1659 - Progressing by Aleen Campi, RN Clinical Measurements: Ability to maintain clinical measurements within normal limits will improve 06/10/2017 1659 - Progressing by Aleen Campi, RN Will remain free from infection 06/10/2017 1659 - Progressing by Aleen Campi, RN Diagnostic test results will improve 06/10/2017 1659 - Progressing by Aleen Campi, RN Respiratory complications will improve 06/10/2017 1659 - Progressing by Aleen Campi, RN Cardiovascular complication will be avoided 06/10/2017 1659 - Progressing by Aleen Campi, RN Activity: Risk for activity intolerance will decrease 06/10/2017 1659 - Progressing by Aleen Campi, RN Nutrition: Adequate nutrition will be maintained 06/10/2017 1659 - Progressing by Aleen Campi, RN Coping: Level of anxiety will decrease 06/10/2017 1659 - Progressing by Aleen Campi, RN Elimination: Will not experience complications related to bowel motility 06/10/2017 1659 - Progressing by Aleen Campi, RN Will not experience complications related to urinary retention 06/10/2017 1659 - Progressing by Aleen Campi, RN Pain Managment: General experience of comfort will improve 06/10/2017 1659 - Progressing by Aleen Campi, RN Safety: Ability to remain free from injury will improve 06/10/2017 1659 - Progressing by Aleen Campi, RN Skin Integrity: Risk for impaired skin integrity will decrease 06/10/2017 1659 - Progressing by Aleen Campi, RN

## 2017-06-10 NOTE — ED Triage Notes (Signed)
Pt arrived via EMS from home d/t chest pain x2 days. Pt reports right sided chest pain and diarrhea. Pt denies any n/v, SOB, or dizziness. Pt has hx of MI, stent placement, and A. Fib. Pt is A&O x4 at this time.

## 2017-06-10 NOTE — ED Notes (Addendum)
Patient was given 324 ASA, 1 nitro and 2 in of Nitro paste via EMS PTA

## 2017-06-11 ENCOUNTER — Observation Stay: Payer: Federal, State, Local not specified - PPO

## 2017-06-11 LAB — NM MYOCAR MULTI W/SPECT W/WALL MOTION / EF
CHL CUP NUCLEAR SSS: 9
CHL CUP RESTING HR STRESS: 57 {beats}/min
CSEPPHR: 90 {beats}/min
Estimated workload: 1 METS
Exercise duration (min): 1 min
Exercise duration (sec): 40 s
LV dias vol: 56 mL (ref 62–150)
LV sys vol: 18 mL
NUC STRESS TID: 1.14
SDS: 1
SRS: 12

## 2017-06-11 LAB — GLUCOSE, CAPILLARY: Glucose-Capillary: 177 mg/dL — ABNORMAL HIGH (ref 65–99)

## 2017-06-11 LAB — TROPONIN I

## 2017-06-11 MED ORDER — TECHNETIUM TC 99M TETROFOSMIN IV KIT
32.9400 | PACK | Freq: Once | INTRAVENOUS | Status: AC | PRN
  Administered 2017-06-11: 32.94 via INTRAVENOUS

## 2017-06-11 MED ORDER — NITROGLYCERIN 0.4 MG SL SUBL: 0.4000 mg | SUBLINGUAL_TABLET | SUBLINGUAL | 0 refills | Status: DC | PRN

## 2017-06-11 MED ORDER — TECHNETIUM TC 99M TETROFOSMIN IV KIT
14.1000 | PACK | Freq: Once | INTRAVENOUS | Status: AC | PRN
  Administered 2017-06-11: 14.1 via INTRAVENOUS

## 2017-06-11 MED ORDER — REGADENOSON 0.4 MG/5ML IV SOLN
0.4000 mg | Freq: Once | INTRAVENOUS | Status: AC
  Administered 2017-06-11: 0.4 mg via INTRAVENOUS

## 2017-06-11 NOTE — Progress Notes (Signed)
Stress negative per Dr. Saralyn Pilar. Will proceed with discharge.

## 2017-06-11 NOTE — Discharge Summary (Signed)
[facility] [place] NAME: Joseph Mann    MR#:  [id number]  DATE OF BIRTH:  1940/11/26  DATE OF ADMISSION:  06/09/2017 ADMITTING PHYSICIAN: Bettey Costa, MD  DATE OF DISCHARGE: 06/11/2017  PRIMARY CARE PHYSICIAN: Kirk Ruths, MD    ADMISSION DIAGNOSIS:  Chest pain, unspecified type [R07.9]  DISCHARGE DIAGNOSIS:  Active Problems:   Chest pain   SECONDARY DIAGNOSIS:   Past Medical History:  Diagnosis Date  . BPH (benign prostatic hyperplasia)   . Diabetes mellitus without complication ([place])   . GERD (gastroesophageal reflux disease)   . Headache    migraines  . Hyperlipidemia   . Hypertension   . MI (myocardial infarction) [facility])     HOSPITAL COURSE:    77 year old male with known CAD, diabetes and essential hypertension who presents with chest pain.  1. Atypical chest pain: Patient was ruled out for ACS. Troponins were negative. Telemetry showed no acute changes.  Patient underwent stress test that was low risk.  2. Diabetes: He will continue outpatient regimen with ADA diet 3. Diarrhea: Stool studies were negative. He had no diarrhea while in the hospital.  4. Essential hypertension: Continue losartan and metoprolol  5. PAF: Continue anticoagulation with Eliquis and metoprolol for heart rate control     DISCHARGE CONDITIONS AND DIET:   Stable for discharge and diabetic cardiac diet  CONSULTS OBTAINED:  Treatment Team:  Isaias Cowman, MD  DRUG ALLERGIES:   Allergies  Allergen Reactions  . Amoxicillin Hives  . Iodinated Diagnostic Agents Other (See Comments)  . Lipitor [Atorvastatin]   . Plavix [Clopidogrel] Hives    DISCHARGE MEDICATIONS:   Allergies as of 06/11/2017      Reactions   Amoxicillin Hives   Iodinated Diagnostic Agents Other (See Comments)   Lipitor [atorvastatin]    Plavix [clopidogrel] Hives      Medication List    STOP taking these medications   clindamycin 300 MG  capsule Commonly known as:  CLEOCIN   HYDROcodone-acetaminophen 5-325 MG tablet Commonly known as:  NORCO/VICODIN     TAKE these medications   aspirin EC 81 MG tablet Take 81 mg by mouth daily. Reported on 05/15/2015   ELIQUIS 5 MG Tabs tablet Generic drug:  apixaban Take 5 mg by mouth 2 (two) times daily. What changed:  Another medication with the same name was removed. Continue taking this medication, and follow the directions you see here.   loratadine 10 MG tablet Commonly known as:  CLARITIN Take 10 mg by mouth daily.   losartan 25 MG tablet Commonly known as:  COZAAR Take 25 mg by mouth daily.   metFORMIN 500 MG 24 hr tablet Commonly known as:  GLUCOPHAGE-XR Take 500 mg by mouth daily with breakfast.   metoprolol tartrate 50 MG tablet Commonly known as:  LOPRESSOR Take 1 tablet (50 mg total) by mouth 2 (two) times daily. What changed:  how much to take   nitroGLYCERIN 0.4 MG SL tablet Commonly known as:  NITROSTAT Place 1 tablet (0.4 mg total) under the tongue every 5 (five) minutes as needed for chest pain.   pravastatin 20 MG tablet Commonly known as:  PRAVACHOL Take 20 mg by mouth at bedtime.         Today   CHIEF COMPLAINT:  No chest pain overnight   VITAL SIGNS:  Blood pressure (!) 151/81, pulse (!) 57, temperature 98.1 F (36.7 C), temperature source Oral, resp. rate 17, height 6\' 2"  (  1.88 m), weight 92.8 kg (204 lb 9.6 oz), SpO2 94 %.   REVIEW OF SYSTEMS:  Review of Systems  Constitutional: Negative.  Negative for chills, fever and malaise/fatigue.  HENT: Negative.  Negative for ear discharge, ear pain, hearing loss, nosebleeds and sore throat.   Eyes: Negative.  Negative for blurred vision and pain.  Respiratory: Negative.  Negative for cough, hemoptysis, shortness of breath and wheezing.   Cardiovascular: Negative.  Negative for chest pain, palpitations and leg swelling.  Gastrointestinal: Negative.  Negative for abdominal pain, blood in  stool, diarrhea, nausea and vomiting.  Genitourinary: Negative.  Negative for dysuria.  Musculoskeletal: Negative.  Negative for back pain.  Skin: Negative.   Neurological: Negative for dizziness, tremors, speech change, focal weakness, seizures and headaches.  Endo/Heme/Allergies: Negative.  Does not bruise/bleed easily.  Psychiatric/Behavioral: Negative.  Negative for depression, hallucinations and suicidal ideas.     PHYSICAL EXAMINATION:  GENERAL:  77 y.o.-year-old patient lying in the bed with no acute distress.  NECK:  Supple, no jugular venous distention. No thyroid enlargement, no tenderness.  LUNGS: Normal breath sounds bilaterally, no wheezing, rales,rhonchi  No use of accessory muscles of respiration.  CARDIOVASCULAR: S1, S2 normal. No murmurs, rubs, or gallops.  ABDOMEN: Soft, non-tender, non-distended. Bowel sounds present. No organomegaly or mass.  EXTREMITIES: No pedal edema, cyanosis, or clubbing.  PSYCHIATRIC: The patient is alert and oriented x 3.  SKIN: No obvious rash, lesion, or ulcer.   DATA REVIEW:   CBC Recent Labs  Lab 06/10/17 0006  WBC 5.0  HGB 12.0*  HCT 35.3*  PLT 211    Chemistries  Recent Labs  Lab 06/10/17 0006  NA 140  K 4.0  CL 105  CO2 27  GLUCOSE 225*  BUN 16  CREATININE 0.74  CALCIUM 9.8    Cardiac Enzymes Recent Labs  Lab 06/10/17 1229 06/10/17 1834 06/11/17 0032  TROPONINI <0.03 <0.03 <0.03    Microbiology Results  @[email or web address] @  RADIOLOGY:  Ct Abdomen Pelvis Wo Contrast  Result Date: 06/10/2017 CLINICAL DATA:  77 year old male with abdominal pain. Concern for acute diverticulitis. EXAM: CT ABDOMEN AND PELVIS WITHOUT CONTRAST TECHNIQUE: Multidetector CT imaging of the abdomen and pelvis was performed following the standard protocol without IV contrast. COMPARISON:  CT dated 07/13/2012 and abdominal ultrasound dated 07/13/2012 FINDINGS: Evaluation of this exam is limited in the absence of intravenous contrast. Lower  chest: Minimal bibasilar atelectatic changes. Multi vessel coronary vascular calcification. No intra-abdominal free air or free fluid. Hepatobiliary: Cholecystectomy. The liver is unremarkable. No intrahepatic biliary ductal dilatation. Pancreas: Unremarkable. No pancreatic ductal dilatation or surrounding inflammatory changes. Spleen: Normal in size without focal abnormality. Adrenals/Urinary Tract: The adrenal glands are unremarkable. There is no hydronephrosis or nephrolithiasis on either side. Multiple small bilateral parapelvic cysts. The visualized ureters and urinary bladder appear unremarkable. Stomach/Bowel: There is a 15 mm duodenal diverticulum. There is scattered sigmoid diverticula without active inflammatory changes. No bowel obstruction or active inflammation. A small hiatal hernia. The appendix is normal. Vascular/Lymphatic: Advanced aortoiliac atherosclerotic disease. 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 evaluation of the vasculature is limited in the absence of intravenous contrast. Dilatation of the common iliac arteries bilaterally measure up to 1.7 cm on the left. No portal venous gas. There is no adenopathy. Reproductive: The prostate and seminal vesicles are grossly unremarkable. Other: None Musculoskeletal: Osteopenia with degenerative changes of the spine. Multilevel disc desiccation and vacuum phenomena. No acute osseous pathology. IMPRESSION: 1.  No acute intra-abdominal or pelvic pathology. Colonic diverticulosis. No bowel obstruction or active inflammation. Normal appendix. 2. A 3.4 cm infrarenal aortic aneurysm. Recommend followup by ultrasound in 3 years. This recommendation follows ACR consensus guidelines: White Paper of the ACR Incidental Findings Committee II on Vascular Findings. J Am Coll Radiol 2013; 94:709-628 3.  Aortic Atherosclerosis (ICD10-I70.0). Electronically Signed   By: Anner Crete M.D.   On: 06/10/2017 04:28    Dg Chest Port 1 View  Result Date: 06/10/2017 CLINICAL DATA:  77 year old male with chest pain. EXAM: PORTABLE CHEST 1 VIEW COMPARISON:  Chest radiograph dated 12/31/2016 FINDINGS: The lungs are clear. There is no pleural effusion or pneumothorax. The cardiac silhouette is within normal limits. Old healed right posterior rib fractures. No acute osseous pathology. IMPRESSION: No active disease. Electronically Signed   By: Anner Crete M.D.   On: 06/10/2017 00:26      Allergies as of 06/11/2017      Reactions   Amoxicillin Hives   Iodinated Diagnostic Agents Other (See Comments)   Lipitor [atorvastatin]    Plavix [clopidogrel] Hives      Medication List    STOP taking these medications   clindamycin 300 MG capsule Commonly known as:  CLEOCIN   HYDROcodone-acetaminophen 5-325 MG tablet Commonly known as:  NORCO/VICODIN     TAKE these medications   aspirin EC 81 MG tablet Take 81 mg by mouth daily. Reported on 05/15/2015   ELIQUIS 5 MG Tabs tablet Generic drug:  apixaban Take 5 mg by mouth 2 (two) times daily. What changed:  Another medication with the same name was removed. Continue taking this medication, and follow the directions you see here.   loratadine 10 MG tablet Commonly known as:  CLARITIN Take 10 mg by mouth daily.   losartan 25 MG tablet Commonly known as:  COZAAR Take 25 mg by mouth daily.   metFORMIN 500 MG 24 hr tablet Commonly known as:  GLUCOPHAGE-XR Take 500 mg by mouth daily with breakfast.   metoprolol tartrate 50 MG tablet Commonly known as:  LOPRESSOR Take 1 tablet (50 mg total) by mouth 2 (two) times daily. What changed:  how much to take   nitroGLYCERIN 0.4 MG SL tablet Commonly known as:  NITROSTAT Place 1 tablet (0.4 mg total) under the tongue every 5 (five) minutes as needed for chest pain.   pravastatin 20 MG tablet Commonly known as:  PRAVACHOL Take 20 mg by mouth at bedtime.          Management plans discussed with the  patient and he is in agreement. Stable for discharge home  Patient should follow up with cardiology  CODE STATUS:     Code Status Orders  (From admission, onward)        Start     Ordered   06/10/17 1229  Full code  Continuous     06/10/17 1228    Code Status History    Date Active Date Inactive Code Status Order ID Comments User Context   This patient has a current code status but no historical code status.    Advance Directive Documentation     Most Recent Value  Type of Advance Directive  Living will, Healthcare Power of Attorney  Pre-existing out of facility DNR order (yellow form or pink MOST form)  No data  "MOST" Form in Place?  No data      TOTAL TIME TAKING CARE OF THIS PATIENT: 38 minutes.    Note: This dictation  was prepared with Dragon dictation along with smaller phrase technology. Any transcriptional errors that result from this process are unintentional.  Clete Kuch M.D on 06/11/2017 at 8:35 AM  Between 7am to 6pm - Pager - [phone] After 6pm go to [email or web address] - password [email or web address] [facility]  Office  [phone]  CC: Primary care physician; Kirk Ruths, MD

## 2017-06-11 NOTE — Progress Notes (Signed)
Per Dr. Benjie Karvonen, patient can have lunch while waiting for stress test results.

## 2017-06-11 NOTE — Consult Note (Signed)
[facility] Cardiology  CARDIOLOGY CONSULT NOTE  Patient ID: Joseph Mann MRN: [id number] DOB/AGE: Aug 13, 1940 77 y.o.  Admit date: 06/09/2017 Referring Physician [facility] Primary Physician [facility] Primary Cardiologist [facility] Reason for Consultation chest pain  HPI: 77 year old gentleman referred for evaluation of chest pain.  The patient has known coronary disease, status post prior coronary stent.  Cardiac catheterization 05/15/2015 revealed patent stent proximal LAD with normal left ventricular function.  Patient is admitted with 2-day history of intermittent right-sided chest pain.  He has ruled out for myocardial infarction with negative troponin less than 0.03 x 3.  ECG is nondiagnostic.  The patient has paroxysmal atrial fibrillation, on Eliquis for stroke prevention.  Review of systems complete and found to be negative unless listed above     Past Medical History:  Diagnosis Date  . BPH (benign prostatic hyperplasia)   . Diabetes mellitus without complication ([place])   . GERD (gastroesophageal reflux disease)   . Headache    migraines  . Hyperlipidemia   . Hypertension   . MI (myocardial infarction) [facility])     Past Surgical History:  Procedure Laterality Date  . BACK SURGERY    . CARDIAC CATHETERIZATION Left 05/15/2015   Procedure: Left Heart Cath and Coronary Angiography;  Surgeon: Yolonda Kida, MD;  Location: [facility] CV LAB;  Service: Cardiovascular;  Laterality: Left;  . CHOLECYSTECTOMY    . COLONOSCOPY N/A 03/10/2015   Procedure: COLONOSCOPY;  Surgeon: Manya Silvas, MD;  Location: [facility] ENDOSCOPY;  Service: Endoscopy;  Laterality: N/A;   IDDM  Vancomycin/ Gentamycin Preop  . ESOPHAGOGASTRODUODENOSCOPY (EGD) WITH PROPOFOL N/A 05/22/2015   Procedure: ESOPHAGOGASTRODUODENOSCOPY (EGD) WITH PROPOFOL;  Surgeon: Manya Silvas, MD;  Location: [facility] ENDOSCOPY;  Service: Endoscopy;  Laterality: N/A;  . HERNIA REPAIR    . REPLACEMENT TOTAL KNEE      Medications Prior to  Admission  Medication Sig Dispense Refill Last Dose  . apixaban (ELIQUIS) 5 MG TABS tablet Take 5 mg by mouth 2 (two) times daily.   06/09/2017 at 1800  . metFORMIN (GLUCOPHAGE-XR) 500 MG 24 hr tablet Take 500 mg by mouth daily with breakfast.    06/09/2017 at Unknown time  . metoprolol tartrate (LOPRESSOR) 50 MG tablet Take 1 tablet (50 mg total) by mouth 2 (two) times daily. (Patient taking differently: Take 25 mg by mouth 2 (two) times daily. ) 60 tablet 0 06/09/2017 at Unknown time  . pravastatin (PRAVACHOL) 20 MG tablet Take 20 mg by mouth at bedtime.   06/09/2017 at Unknown time  . aspirin EC 81 MG tablet Take 81 mg by mouth daily. Reported on 05/15/2015   Not Taking at Unknown time  . clindamycin (CLEOCIN) 300 MG capsule Take 1 capsule (300 mg total) by mouth 3 (three) times daily. (Patient not taking: Reported on 12/31/2016) 15 capsule 0 Not Taking at Unknown time  . ELIQUIS STARTER PACK (ELIQUIS STARTER PACK) 5 MG TABS Take as directed on package: start with two-5mg  tablets twice daily for 7 days. On day 8, switch to one-5mg  tablet twice daily. (Patient not taking: Reported on 06/10/2017) 1 each 0 Not Taking at Unknown time  . HYDROcodone-acetaminophen (NORCO/VICODIN) 5-325 MG tablet Take 1 tablet by mouth every 4 (four) hours as needed for moderate pain. (Patient not taking: Reported on 12/31/2016) 12 tablet 0 Not Taking at Unknown time  . loratadine (CLARITIN) 10 MG tablet Take 10 mg by mouth daily.   Not Taking at Unknown time  . losartan (COZAAR) 25 MG tablet  Take 25 mg by mouth daily.   Not Taking at Unknown time   Social History   Socioeconomic History  . Marital status: Married    Spouse name: Not on file  . Number of children: Not on file  . Years of education: Not on file  . Highest education level: Not on file  Social Needs  . Financial resource strain: Not on file  . Food insecurity - worry: Not on file  . Food insecurity - inability: Not on file  . Transportation needs - medical:  Not on file  . Transportation needs - non-medical: Not on file  Occupational History  . Not on file  Tobacco Use  . Smoking status: Never Smoker  . Smokeless tobacco: Never Used  Substance and Sexual Activity  . Alcohol use: No    Comment: occasional  . Drug use: No  . Sexual activity: Not on file  Other Topics Concern  . Not on file  Social History Narrative  . Not on file    History reviewed. No pertinent family history.    Review of systems complete and found to be negative unless listed above      PHYSICAL EXAM  General: Well developed, well nourished, in no acute distress HEENT:  Normocephalic and atramatic Neck:  No JVD.  Lungs: Clear bilaterally to auscultation and percussion. Heart: HRRR . Normal S1 and S2 without gallops or murmurs.  Abdomen: Bowel sounds are positive, abdomen soft and non-tender  Msk:  Back normal, normal gait. Normal strength and tone for age. Extremities: No clubbing, cyanosis or edema.   Neuro: Alert and oriented X 3. Psych:  Good affect, responds appropriately  Labs:   Lab Results  Component Value Date   WBC 5.0 06/10/2017   HGB 12.0 (L) 06/10/2017   HCT 35.3 (L) 06/10/2017   MCV 97.6 06/10/2017   PLT 211 06/10/2017    Recent Labs  Lab 06/10/17 0006  NA 140  K 4.0  CL 105  CO2 27  BUN 16  CREATININE 0.74  CALCIUM 9.8  GLUCOSE 225*   Lab Results  Component Value Date   CKTOTAL 92 08/06/2013   CKMB 3.1 08/07/2013   TROPONINI <0.03 06/11/2017    Lab Results  Component Value Date   CHOL 155 08/07/2013   Lab Results  Component Value Date   HDL 43 08/07/2013   Lab Results  Component Value Date   LDLCALC 98 08/07/2013   Lab Results  Component Value Date   TRIG 72 08/07/2013   No results found for: CHOLHDL No results found for: LDLDIRECT    Radiology: Ct Abdomen Pelvis Wo Contrast  Result Date: 06/10/2017 CLINICAL DATA:  77 year old male with abdominal pain. Concern for acute diverticulitis. EXAM: CT ABDOMEN  AND PELVIS WITHOUT CONTRAST TECHNIQUE: Multidetector CT imaging of the abdomen and pelvis was performed following the standard protocol without IV contrast. COMPARISON:  CT dated 07/13/2012 and abdominal ultrasound dated 07/13/2012 FINDINGS: Evaluation of this exam is limited in the absence of intravenous contrast. Lower chest: Minimal bibasilar atelectatic changes. Multi vessel coronary vascular calcification. No intra-abdominal free air or free fluid. Hepatobiliary: Cholecystectomy. The liver is unremarkable. No intrahepatic biliary ductal dilatation. Pancreas: Unremarkable. No pancreatic ductal dilatation or surrounding inflammatory changes. Spleen: Normal in size without focal abnormality. Adrenals/Urinary Tract: The adrenal glands are unremarkable. There is no hydronephrosis or nephrolithiasis on either side. Multiple small bilateral parapelvic cysts. The visualized ureters and urinary bladder appear unremarkable. Stomach/Bowel: There is a 15 mm duodenal  diverticulum. There is scattered sigmoid diverticula without active inflammatory changes. No bowel obstruction or active inflammation. A small hiatal hernia. The appendix is normal. Vascular/Lymphatic: Advanced aortoiliac atherosclerotic disease. 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 evaluation of the vasculature is limited in the absence of intravenous contrast. Dilatation of the common iliac arteries bilaterally measure up to 1.7 cm on the left. No portal venous gas. There is no adenopathy. Reproductive: The prostate and seminal vesicles are grossly unremarkable. Other: None Musculoskeletal: Osteopenia with degenerative changes of the spine. Multilevel disc desiccation and vacuum phenomena. No acute osseous pathology. IMPRESSION: 1. No acute intra-abdominal or pelvic pathology. Colonic diverticulosis. No bowel obstruction or active inflammation. Normal appendix. 2. A 3.4 cm infrarenal aortic aneurysm.  Recommend followup by ultrasound in 3 years. This recommendation follows ACR consensus guidelines: White Paper of the ACR Incidental Findings Committee II on Vascular Findings. J Am Coll Radiol 2013; 06:269-485 3.  Aortic Atherosclerosis (ICD10-I70.0). Electronically Signed   By: Anner Crete M.D.   On: 06/10/2017 04:28   Dg Chest Port 1 View  Result Date: 06/10/2017 CLINICAL DATA:  77 year old male with chest pain. EXAM: PORTABLE CHEST 1 VIEW COMPARISON:  Chest radiograph dated 12/31/2016 FINDINGS: The lungs are clear. There is no pleural effusion or pneumothorax. The cardiac silhouette is within normal limits. Old healed right posterior rib fractures. No acute osseous pathology. IMPRESSION: No active disease. Electronically Signed   By: Anner Crete M.D.   On: 06/10/2017 00:26    EKG: Sinus bradycardia  ASSESSMENT AND PLAN:   1.  Chest pain, with typical and atypical features, negative troponin, nondiagnostic ECG 2.  Paroxysmal atrial fibrillation, currently sinus bradycardia, on Eliquis for stroke prevention  Recommendations  1.  Agree with current therapy 2.  Review Lexiscan Myoview 3.  Continue Eliquis for stroke prevention  Signed: Isaias Cowman MD,PhD, [facility] 06/11/2017, 10:32 AM

## 2017-06-11 NOTE — Progress Notes (Signed)
Irine Seal to be D/C'd Home per MD order. Patient given discharge teaching and paperwork regarding medications, diet, follow-up appointments and activity. Patient understanding verbalized. No questions or complaints at this time. Skin condition as charted. IV and telemetry removed prior to leaving.  No further needs by Care Management/Social Work. Prescriptions printed for patient.   An After Visit Summary was printed and given to the patient. Caregiver/family present during discharge teaching.     Terrilyn Saver

## 2018-08-11 IMAGING — CT CT ABD-PELV W/O CM
2 of 4 series · 16 of 46 positions shown, 18 images · non-contrast
Comparison: CT dated 07/13/2012 and abdominal ultrasound dated
07/13/2012

CLINICAL DATA: 76-year-old male with abdominal pain. Concern for
acute diverticulitis.

EXAM:
CT ABDOMEN AND PELVIS WITHOUT CONTRAST
TECHNIQUE: Multidetector CT imaging of the abdomen and pelvis was performed
following the standard protocol without IV contrast.

[Series 2: routine abd/pel wo · axial · 0.90mm/px · z∈[-1013,-548]mm · 13 of 103 slices shown, 15 images]
[im 5/103  soft-tissue]
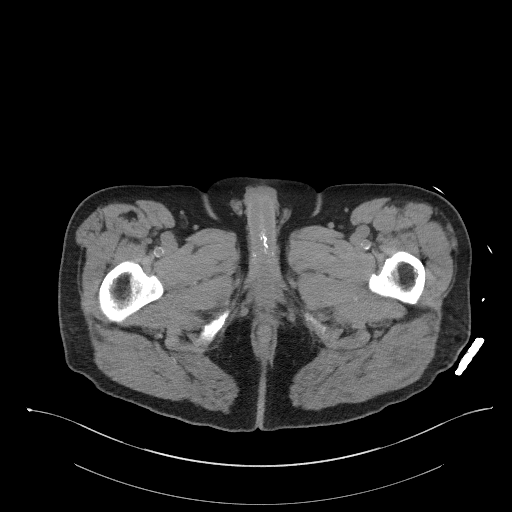
[im 5/103  bone]
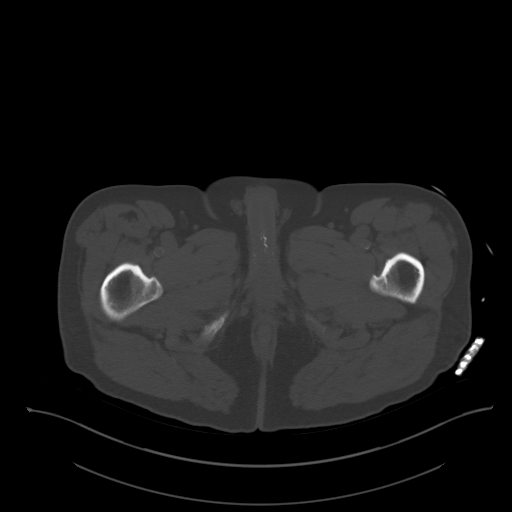
[im 14/103  soft-tissue]
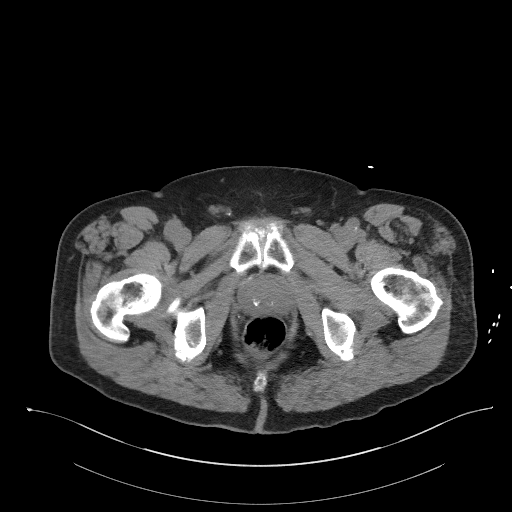
[im 23/103  soft-tissue]
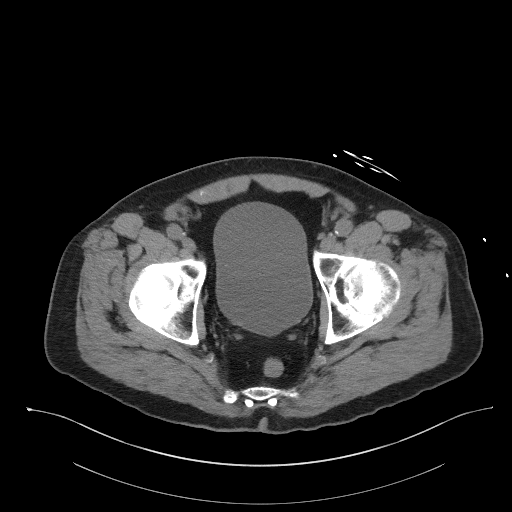
[im 27/103  soft-tissue]
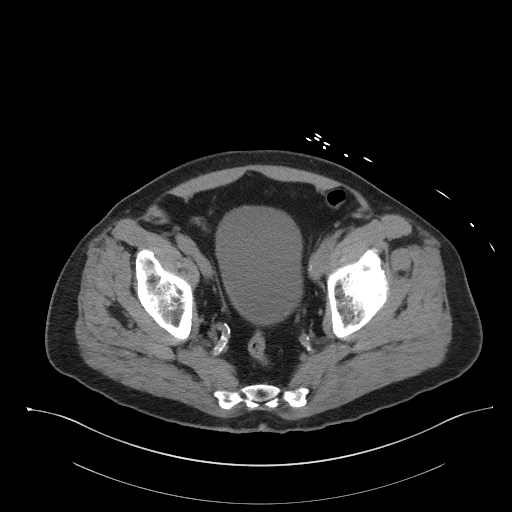
[im 36/103  soft-tissue]
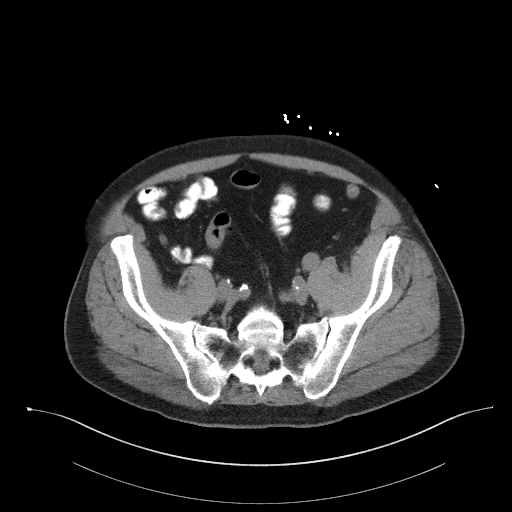
[im 45/103  soft-tissue]
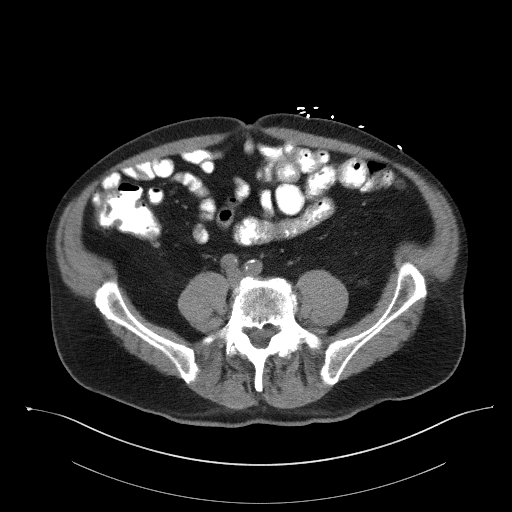
[im 54/103  soft-tissue]
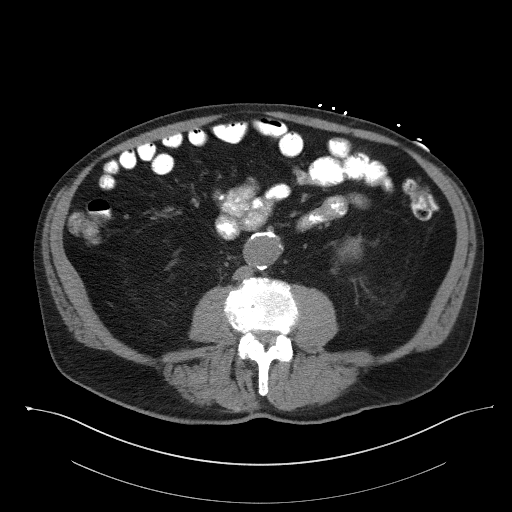
[im 58/103  soft-tissue]
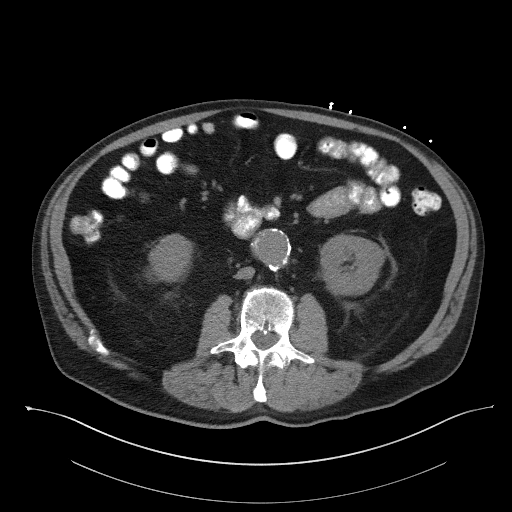
[im 67/103  soft-tissue]
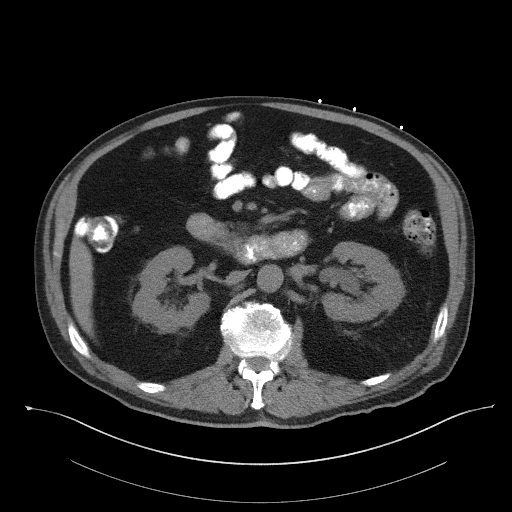
[im 67/103  bone]
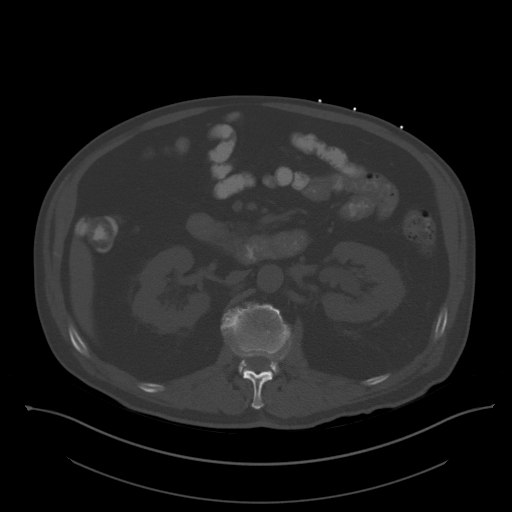
[im 76/103  soft-tissue]
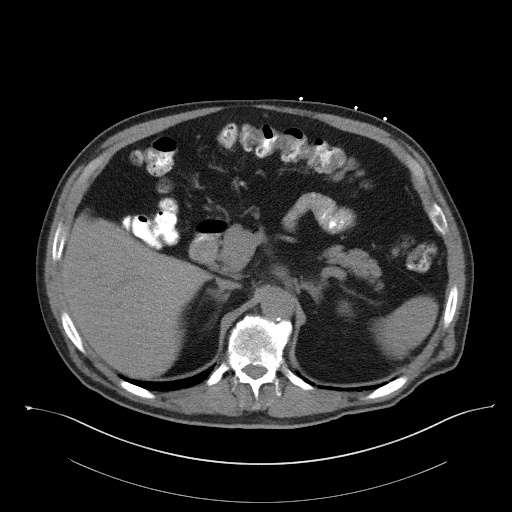
[im 80/103  soft-tissue]
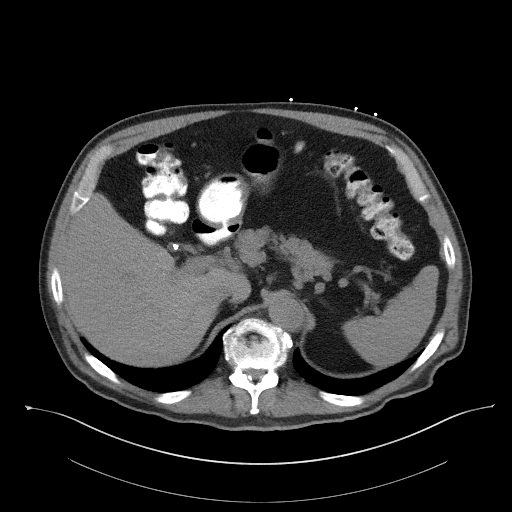
[im 89/103  soft-tissue]
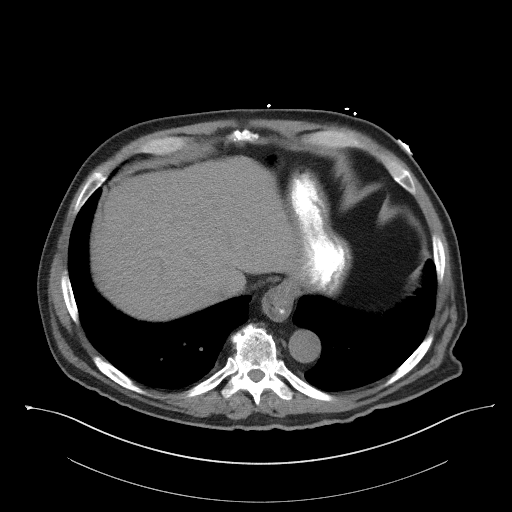
[im 98/103  soft-tissue]
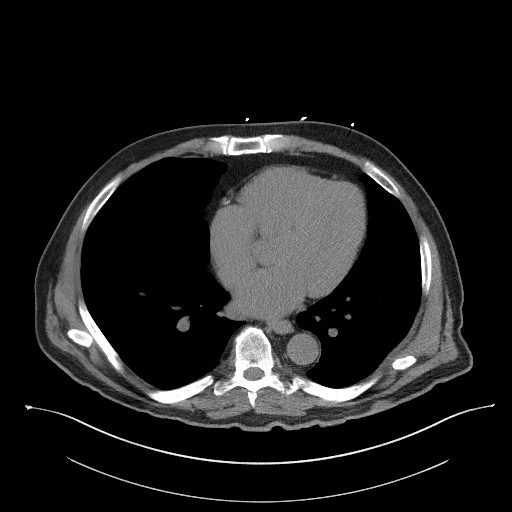

[Series 5: coronal st · coronal · 0.83mm/px · 3 of 93 slices shown]
[im 31/93  soft-tissue]
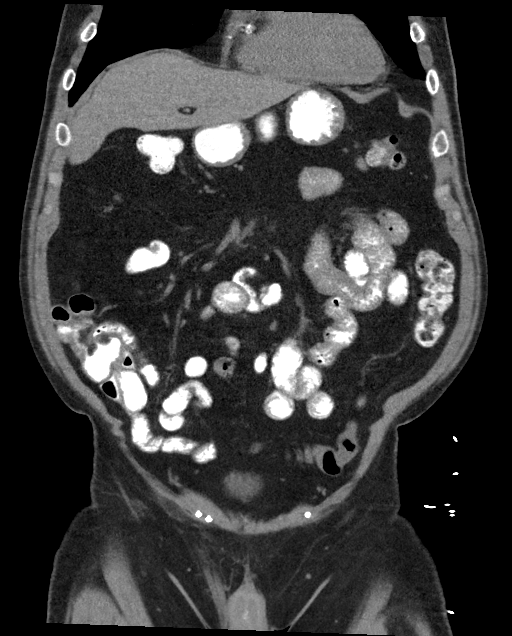
[im 41/93  soft-tissue]
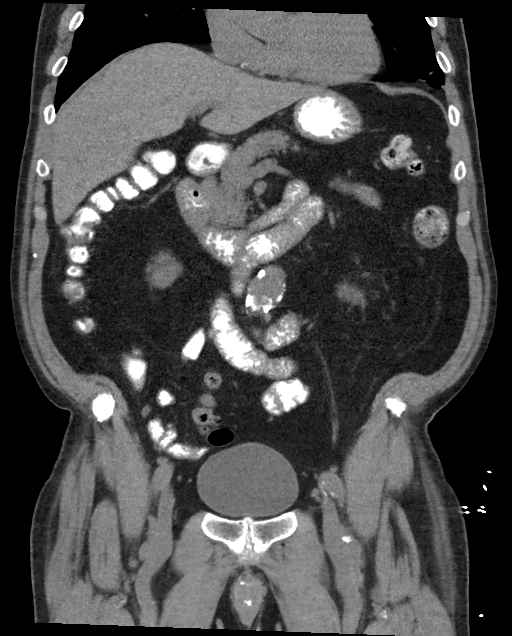
[im 52/93  soft-tissue]
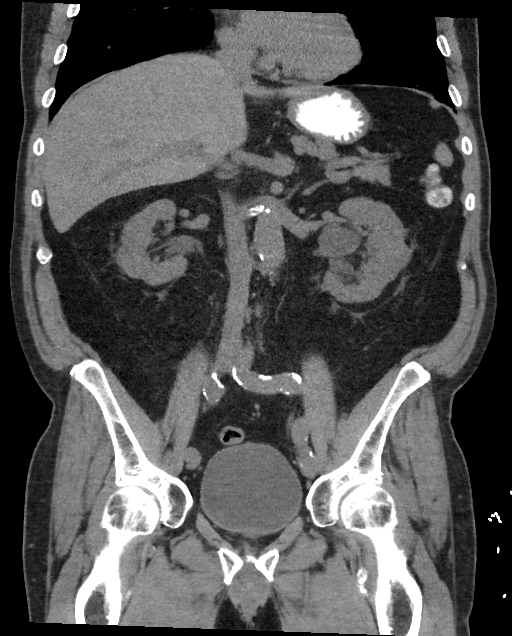

[16 of 46 positions shown; findings below may reference images not displayed]

FINDINGS: Evaluation of this exam is limited in the absence of intravenous
contrast.

Lower chest: Minimal bibasilar atelectatic changes. Multi vessel
coronary vascular calcification.

No intra-abdominal free air or free fluid.

Hepatobiliary: Cholecystectomy. The liver is unremarkable. No
intrahepatic biliary ductal dilatation.

Pancreas: Unremarkable. No pancreatic ductal dilatation or
surrounding inflammatory changes.

Spleen: Normal in size without focal abnormality.

Adrenals/Urinary Tract: The adrenal glands are unremarkable. There
is no hydronephrosis or nephrolithiasis on either side. Multiple
small bilateral parapelvic cysts. The visualized ureters and urinary
bladder appear unremarkable.

Stomach/Bowel: There is a 15 mm duodenal diverticulum. There is
scattered sigmoid diverticula without active inflammatory changes.
No bowel obstruction or active inflammation. A small hiatal hernia.
The appendix is normal.

Vascular/Lymphatic: Advanced aortoiliac atherosclerotic disease.
There is a 3.4 cm fusiform infrarenal abdominal aortic aneurysm
grossly stable since 3104. evaluation of the vasculature is limited
in the absence of intravenous contrast. Dilatation of the common
iliac arteries bilaterally measure up to 1.7 cm on the left. No
portal venous gas. There is no adenopathy.

Reproductive: The prostate and seminal vesicles are grossly
unremarkable.

Other: None

Musculoskeletal: Osteopenia with degenerative changes of the spine.
Multilevel disc desiccation and vacuum phenomena. No acute osseous
pathology.
IMPRESSION: 1. No acute intra-abdominal or pelvic pathology. Colonic
diverticulosis. No bowel obstruction or active inflammation. Normal
appendix.
2. A 3.4 cm infrarenal aortic aneurysm. Recommend followup by
ultrasound in 3 years. This recommendation follows ACR consensus
guidelines: White Paper of the ACR Incidental Findings Committee II
3.  Aortic Atherosclerosis ([id number]).

## 2020-07-28 ENCOUNTER — Other Ambulatory Visit: Payer: Self-pay | Admitting: Orthopedic Surgery

## 2020-07-28 DIAGNOSIS — M5416 Radiculopathy, lumbar region: Secondary | ICD-10-CM

## 2020-08-07 ENCOUNTER — Ambulatory Visit
Admission: RE | Admit: 2020-08-07 | Discharge: 2020-08-07 | Disposition: A | Discharge status: Home / Self Care | Payer: Federal, State, Local not specified - PPO | Source: Ambulatory Visit | Attending: Orthopedic Surgery | Admitting: Orthopedic Surgery

## 2020-08-07 ENCOUNTER — Other Ambulatory Visit: Payer: Self-pay

## 2020-08-07 DIAGNOSIS — M5416 Radiculopathy, lumbar region: Secondary | ICD-10-CM | POA: Diagnosis not present

## 2021-09-25 ENCOUNTER — Other Ambulatory Visit: Payer: Self-pay | Admitting: Orthopedic Surgery

## 2021-09-25 DIAGNOSIS — M5441 Lumbago with sciatica, right side: Secondary | ICD-10-CM

## 2021-09-25 DIAGNOSIS — M5416 Radiculopathy, lumbar region: Secondary | ICD-10-CM

## 2021-09-25 DIAGNOSIS — M4807 Spinal stenosis, lumbosacral region: Secondary | ICD-10-CM

## 2021-09-25 DIAGNOSIS — Z9889 Other specified postprocedural states: Secondary | ICD-10-CM

## 2021-10-04 ENCOUNTER — Ambulatory Visit
Admission: RE | Admit: 2021-10-04 | Discharge: 2021-10-04 | Disposition: A | Discharge status: Home / Self Care | Payer: Federal, State, Local not specified - PPO | Source: Ambulatory Visit | Attending: Orthopedic Surgery | Admitting: Orthopedic Surgery

## 2021-10-04 DIAGNOSIS — Z9889 Other specified postprocedural states: Secondary | ICD-10-CM | POA: Diagnosis present

## 2021-10-04 DIAGNOSIS — M5441 Lumbago with sciatica, right side: Secondary | ICD-10-CM | POA: Diagnosis present

## 2021-10-04 DIAGNOSIS — M4807 Spinal stenosis, lumbosacral region: Secondary | ICD-10-CM | POA: Diagnosis present

## 2021-10-04 DIAGNOSIS — M5416 Radiculopathy, lumbar region: Secondary | ICD-10-CM | POA: Diagnosis present

## 2022-01-05 ENCOUNTER — Other Ambulatory Visit
Admission: RE | Admit: 2022-01-05 | Discharge: 2022-01-05 | Disposition: A | Discharge status: Home / Self Care | Payer: Federal, State, Local not specified - PPO | Source: Ambulatory Visit | Attending: Student | Admitting: Student

## 2022-01-05 DIAGNOSIS — Z01818 Encounter for other preprocedural examination: Secondary | ICD-10-CM | POA: Insufficient documentation

## 2022-01-05 LAB — BRAIN NATRIURETIC PEPTIDE: B Natriuretic Peptide: 233.9 pg/mL — ABNORMAL HIGH (ref 0.0–100.0)

## 2022-01-19 ENCOUNTER — Ambulatory Visit
Admission: RE | Admit: 2022-01-19 | Discharge: 2022-01-19 | Disposition: A | Discharge status: Home / Self Care | Payer: Federal, State, Local not specified - PPO | Attending: Internal Medicine | Admitting: Internal Medicine

## 2022-01-19 ENCOUNTER — Encounter
Admission: RE | Disposition: A | Discharge status: Home / Self Care | Payer: Self-pay | Source: Home / Self Care | Attending: Internal Medicine

## 2022-01-19 ENCOUNTER — Other Ambulatory Visit: Payer: Self-pay

## 2022-01-19 ENCOUNTER — Encounter: Payer: Self-pay | Admitting: Internal Medicine

## 2022-01-19 DIAGNOSIS — I2510 Atherosclerotic heart disease of native coronary artery without angina pectoris: Secondary | ICD-10-CM | POA: Diagnosis not present

## 2022-01-19 DIAGNOSIS — R0609 Other forms of dyspnea: Secondary | ICD-10-CM | POA: Diagnosis not present

## 2022-01-19 DIAGNOSIS — I272 Pulmonary hypertension, unspecified: Secondary | ICD-10-CM | POA: Diagnosis present

## 2022-01-19 DIAGNOSIS — Z955 Presence of coronary angioplasty implant and graft: Secondary | ICD-10-CM | POA: Diagnosis not present

## 2022-01-19 HISTORY — PX: RIGHT/LEFT HEART CATH AND CORONARY ANGIOGRAPHY: CATH118266

## 2022-01-19 LAB — POCT I-STAT EG7
Acid-Base Excess: 1 mmol/L (ref 0.0–2.0)
Bicarbonate: 26.4 mmol/L (ref 20.0–28.0)
Calcium, Ion: 1.16 mmol/L (ref 1.15–1.40)
HCT: 37 % — ABNORMAL LOW (ref 39.0–52.0)
Hemoglobin: 12.6 g/dL — ABNORMAL LOW (ref 13.0–17.0)
O2 Saturation: 68 %
Potassium: 4 mmol/L (ref 3.5–5.1)
Sodium: 140 mmol/L (ref 135–145)
TCO2: 28 mmol/L (ref 22–32)
pCO2, Ven: 43.8 mmHg — ABNORMAL LOW (ref 44–60)
pH, Ven: 7.387 (ref 7.25–7.43)
pO2, Ven: 36 mmHg (ref 32–45)

## 2022-01-19 LAB — POCT I-STAT 7, (LYTES, BLD GAS, ICA,H+H)
Acid-Base Excess: 0 mmol/L (ref 0.0–2.0)
Bicarbonate: 24 mmol/L (ref 20.0–28.0)
Calcium, Ion: 1.12 mmol/L — ABNORMAL LOW (ref 1.15–1.40)
HCT: 40 % (ref 39.0–52.0)
Hemoglobin: 13.6 g/dL (ref 13.0–17.0)
O2 Saturation: 97 %
Potassium: 3.9 mmol/L (ref 3.5–5.1)
Sodium: 139 mmol/L (ref 135–145)
TCO2: 25 mmol/L (ref 22–32)
pCO2 arterial: 36.8 mmHg (ref 32–48)
pH, Arterial: 7.422 (ref 7.35–7.45)
pO2, Arterial: 92 mmHg (ref 83–108)

## 2022-01-19 LAB — GLUCOSE, CAPILLARY: Glucose-Capillary: 86 mg/dL (ref 70–99)

## 2022-01-19 LAB — POCT ACTIVATED CLOTTING TIME
Activated Clotting Time: 0 seconds
Activated Clotting Time: 0 seconds

## 2022-01-19 SURGERY — RIGHT/LEFT HEART CATH AND CORONARY ANGIOGRAPHY
Anesthesia: Moderate Sedation

## 2022-01-19 MED ORDER — LIDOCAINE HCL (PF) 1 % IJ SOLN
INTRAMUSCULAR | Status: DC | PRN
  Administered 2022-01-19: 2 mL
  Administered 2022-01-19: 12 mL
  Administered 2022-01-19: 2 mL

## 2022-01-19 MED ORDER — SODIUM CHLORIDE 0.9 % IV SOLN
250.0000 mL | INTRAVENOUS | Status: DC | PRN
Start: 2022-01-19 — End: 2022-01-19

## 2022-01-19 MED ORDER — SODIUM CHLORIDE 0.9% FLUSH
3.0000 mL | INTRAVENOUS | Status: DC | PRN
Start: 2022-01-19 — End: 2022-01-19

## 2022-01-19 MED ORDER — SODIUM CHLORIDE 0.9% FLUSH
3.0000 mL | Freq: Two times a day (BID) | INTRAVENOUS | Status: DC
Start: 2022-01-19 — End: 2022-01-19

## 2022-01-19 MED ORDER — LABETALOL HCL 5 MG/ML IV SOLN: 10.0000 mg | INTRAVENOUS | Status: DC | PRN

## 2022-01-19 MED ORDER — ASPIRIN 81 MG PO CHEW: 81.0000 mg | CHEWABLE_TABLET | ORAL | Status: DC

## 2022-01-19 MED ORDER — HYDRALAZINE HCL 20 MG/ML IJ SOLN: 10.0000 mg | INTRAMUSCULAR | Status: DC | PRN

## 2022-01-19 MED ORDER — ONDANSETRON HCL 4 MG/2ML IJ SOLN: 4.0000 mg | Freq: Four times a day (QID) | INTRAMUSCULAR | Status: DC | PRN

## 2022-01-19 MED ORDER — IOHEXOL 300 MG/ML  SOLN
INTRAMUSCULAR | Status: DC | PRN
  Administered 2022-01-19: 70 mL

## 2022-01-19 MED ORDER — HEPARIN (PORCINE) IN NACL 1000-0.9 UT/500ML-% IV SOLN
INTRAVENOUS | Status: AC
  Filled 2022-01-19: qty 1000

## 2022-01-19 MED ORDER — SODIUM CHLORIDE 0.9 % IV SOLN: 250.0000 mL | INTRAVENOUS | Status: DC | PRN

## 2022-01-19 MED ORDER — HEPARIN SODIUM (PORCINE) 1000 UNIT/ML IJ SOLN
INTRAMUSCULAR | Status: AC
  Filled 2022-01-19: qty 10

## 2022-01-19 MED ORDER — FENTANYL CITRATE (PF) 100 MCG/2ML IJ SOLN
INTRAMUSCULAR | Status: AC
  Filled 2022-01-19: qty 2

## 2022-01-19 MED ORDER — VERAPAMIL HCL 2.5 MG/ML IV SOLN
INTRAVENOUS | Status: AC
  Filled 2022-01-19: qty 2

## 2022-01-19 MED ORDER — FAMOTIDINE 20 MG PO TABS
ORAL_TABLET | ORAL | Status: AC
  Administered 2022-01-19: 20 mg via ORAL
  Filled 2022-01-19: qty 2

## 2022-01-19 MED ORDER — MIDAZOLAM HCL 2 MG/2ML IJ SOLN
INTRAMUSCULAR | Status: AC
  Filled 2022-01-19: qty 2

## 2022-01-19 MED ORDER — FAMOTIDINE 20 MG PO TABS
20.0000 mg | ORAL_TABLET | Freq: Once | ORAL | Status: AC
Start: 2022-01-19 — End: 2022-01-19

## 2022-01-19 MED ORDER — LIDOCAINE HCL 1 % IJ SOLN
INTRAMUSCULAR | Status: AC
  Filled 2022-01-19: qty 20

## 2022-01-19 MED ORDER — STERILE WATER FOR INJECTION IJ SOLN
INTRAMUSCULAR | Status: AC
  Administered 2022-01-19: 10 mL
  Filled 2022-01-19: qty 10

## 2022-01-19 MED ORDER — SODIUM CHLORIDE 0.9 % WEIGHT BASED INFUSION: 1.0000 mL/kg/h | INTRAVENOUS | Status: DC

## 2022-01-19 MED ORDER — HEPARIN (PORCINE) IN NACL 1000-0.9 UT/500ML-% IV SOLN
INTRAVENOUS | Status: DC | PRN
  Administered 2022-01-19 (×2): 500 mL

## 2022-01-19 MED ORDER — DIPHENHYDRAMINE HCL 50 MG/ML IJ SOLN: 50.0000 mg | Freq: Once | INTRAMUSCULAR | Status: AC

## 2022-01-19 MED ORDER — VERAPAMIL HCL 2.5 MG/ML IV SOLN
INTRAVENOUS | Status: DC | PRN
  Administered 2022-01-19: 2.5 mg via INTRA_ARTERIAL

## 2022-01-19 MED ORDER — ACETAMINOPHEN 325 MG PO TABS: 650.0000 mg | ORAL_TABLET | ORAL | Status: DC | PRN

## 2022-01-19 MED ORDER — DIPHENHYDRAMINE HCL 50 MG/ML IJ SOLN
INTRAMUSCULAR | Status: AC
  Administered 2022-01-19: 50 mg via INTRAVENOUS
  Filled 2022-01-19: qty 1

## 2022-01-19 MED ORDER — METHYLPREDNISOLONE SODIUM SUCC 125 MG IJ SOLR
INTRAMUSCULAR | Status: AC
  Administered 2022-01-19: 125 mg via INTRAVENOUS
  Filled 2022-01-19: qty 2

## 2022-01-19 MED ORDER — SODIUM CHLORIDE 0.9% FLUSH: 3.0000 mL | INTRAVENOUS | Status: DC | PRN

## 2022-01-19 MED ORDER — SODIUM CHLORIDE 0.9 % WEIGHT BASED INFUSION
3.0000 mL/kg/h | INTRAVENOUS | Status: AC
  Administered 2022-01-19: 3 mL/kg/h via INTRAVENOUS

## 2022-01-19 MED ORDER — HEPARIN SODIUM (PORCINE) 1000 UNIT/ML IJ SOLN
INTRAMUSCULAR | Status: DC | PRN
  Administered 2022-01-19: 4500 [IU] via INTRAVENOUS

## 2022-01-19 MED ORDER — MIDAZOLAM HCL 2 MG/2ML IJ SOLN
INTRAMUSCULAR | Status: DC | PRN
  Administered 2022-01-19: 1 mg via INTRAVENOUS

## 2022-01-19 MED ORDER — SODIUM CHLORIDE 0.9% FLUSH: 3.0000 mL | Freq: Two times a day (BID) | INTRAVENOUS | Status: DC

## 2022-01-19 MED ORDER — METHYLPREDNISOLONE SODIUM SUCC 125 MG IJ SOLR: 125.0000 mg | Freq: Once | INTRAMUSCULAR | Status: AC

## 2022-01-19 MED ORDER — SODIUM CHLORIDE 0.9 % WEIGHT BASED INFUSION
1.0000 mL/kg/h | INTRAVENOUS | Status: DC
  Administered 2022-01-19: 1 mL/kg/h via INTRAVENOUS

## 2022-01-19 MED ORDER — FENTANYL CITRATE (PF) 100 MCG/2ML IJ SOLN
INTRAMUSCULAR | Status: DC | PRN
  Administered 2022-01-19: 25 ug via INTRAVENOUS

## 2022-01-19 SURGICAL SUPPLY — 20 items
CATH 5FR JL3.5 JR4 ANG PIG MP (CATHETERS) IMPLANT
CATH BALLN WEDGE 5F 110CM (CATHETERS) IMPLANT
CATH SWAN GANZ 7F STRAIGHT (CATHETERS) IMPLANT
DEVICE RAD TR BAND REGULAR (VASCULAR PRODUCTS) IMPLANT
DRAPE BRACHIAL (DRAPES) IMPLANT
GLIDESHEATH SLEND SS 6F .021 (SHEATH) IMPLANT
GLIDESHEATH SLENDER 7FR .021G (SHEATH) IMPLANT
GUIDEWIRE EMER 3M J .025X150CM (WIRE) IMPLANT
GUIDEWIRE INQWIRE 1.5J.035X260 (WIRE) IMPLANT
INQWIRE 1.5J .035X260CM (WIRE) ×1
NDL PERC 18GX7CM (NEEDLE) IMPLANT
NEEDLE PERC 18GX7CM (NEEDLE) ×1 IMPLANT
PACK CARDIAC CATH (CUSTOM PROCEDURE TRAY) ×2 IMPLANT
PROTECTION STATION PRESSURIZED (MISCELLANEOUS) ×1
SET ATX SIMPLICITY (MISCELLANEOUS) IMPLANT
SHEATH AVANTI 7FRX11 (SHEATH) IMPLANT
SHEATH GLIDE SLENDER 4/5FR (SHEATH) IMPLANT
STATION PROTECTION PRESSURIZED (MISCELLANEOUS) IMPLANT
WIRE HITORQ VERSACORE ST 145CM (WIRE) IMPLANT
WIRE NITINOL .018 (WIRE) IMPLANT

## 2022-01-25 ENCOUNTER — Encounter: Payer: Self-pay | Admitting: Internal Medicine

## 2022-07-02 ENCOUNTER — Encounter: Payer: Self-pay | Admitting: Gastroenterology

## 2022-07-04 NOTE — H&P (Signed)
Pre-Procedure H&P   Patient ID: Joseph Mann is a 82 y.o. male.  Gastroenterology Provider: Annamaria Helling, DO  Referring Provider: Donnelly Angelica, NP PCP: Kirk Ruths, MD  Date: 07/05/2022  HPI Joseph Mann is a 82 y.o. male who presents today for Esophagogastroduodenoscopy for Melena, dyspepsia, dysphagia .  Patient with recent dysphagia to solid foods.  Occasionally to liquids.  No odynophagia.  Joseph Mann notes it is worse with things like milk. Joseph Mann last underwent EGD in February 2017 noting Schatzki's ring dilated with a 51 French savory over guidewire.  Abnormal esophageal motility was also noted at that time  Joseph Mann has noted some dark stools.  Joseph Mann is on Eliquis which Joseph Mann held for this procedure for 4 days.  Was also notably taking Pepto-Bismol as Joseph Mann noted these dark stools and this has since resolved.  Joseph Mann is on semaglutide which is also been held for this procedure (3/21) Cleared by his pulmonologist to undergo sedation and endoscopy  Status post cholecystectomy  Most recent lab work hemoglobin 14 platelets 174,000 MCV 102.7 creatinine 1.0 BUN 14   Past Medical History:  Diagnosis Date   AAA (abdominal aortic aneurysm) (HCC)    Atrial fibrillation (HCC)    BPH (benign prostatic hyperplasia)    Coronary artery disease    Depression    Diabetes mellitus without complication (HCC)    Dysrhythmia    GERD (gastroesophageal reflux disease)    Headache    migraines   Hyperlipidemia    Hypertension    MI (myocardial infarction) [facility])     Past Surgical History:  Procedure Laterality Date   BACK SURGERY     CARDIAC CATHETERIZATION Left 05/15/2015   Procedure: Left Heart Cath and Coronary Angiography;  Surgeon: Yolonda Kida, MD;  Location: [place] CV LAB;  Service: Cardiovascular;  Laterality: Left;   CHOLECYSTECTOMY     COLONOSCOPY N/A 03/10/2015   Procedure: COLONOSCOPY;  Surgeon: Manya Silvas, MD;  Location: [facility] ENDOSCOPY;  Service:  Endoscopy;  Laterality: N/A;   IDDM  Vancomycin/ Gentamycin Preop   CORONARY ANGIOPLASTY     ESOPHAGOGASTRODUODENOSCOPY (EGD) WITH PROPOFOL N/A 05/22/2015   Procedure: ESOPHAGOGASTRODUODENOSCOPY (EGD) WITH PROPOFOL;  Surgeon: Manya Silvas, MD;  Location: [facility] ENDOSCOPY;  Service: Endoscopy;  Laterality: N/A;   HERNIA REPAIR     REPLACEMENT TOTAL KNEE     RIGHT/LEFT HEART CATH AND CORONARY ANGIOGRAPHY N/A 01/19/2022   Procedure: RIGHT/LEFT HEART CATH AND CORONARY ANGIOGRAPHY;  Surgeon: Yolonda Kida, MD;  Location: [place] CV LAB;  Service: Cardiovascular;  Laterality: N/A;    Family History No h/o GI disease or malignancy  Review of Systems  Constitutional:  Negative for activity change, appetite change, chills, diaphoresis, fatigue, fever and unexpected weight change.  HENT:  Positive for trouble swallowing. Negative for congestion and voice change.   Respiratory:  Negative for shortness of breath and wheezing.   Cardiovascular:  Negative for chest pain, palpitations and leg swelling.  Gastrointestinal:  Negative for abdominal distention, abdominal pain, anal bleeding, blood in stool, constipation, diarrhea, nausea and vomiting.       + melena; + dyspepsia  Musculoskeletal:  Negative for arthralgias and myalgias.  Skin:  Negative for color change and pallor.  Neurological:  Negative for dizziness, syncope and weakness.  Psychiatric/Behavioral:  Negative for confusion. The patient is not nervous/anxious.   All other systems reviewed and are negative.    Medications No current facility-administered medications on file prior to encounter.  Current Outpatient Medications on File Prior to Encounter  Medication Sig Dispense Refill   Budesonide 90 MCG/ACT inhaler Inhale 2 puffs into the lungs daily.     empagliflozin (JARDIANCE) 25 MG TABS tablet Take 25 mg by mouth daily.     escitalopram (LEXAPRO) 20 MG tablet Take 20 mg by mouth daily.     metoprolol tartrate  (LOPRESSOR) 50 MG tablet Take 1 tablet (50 mg total) by mouth 2 (two) times daily. (Patient taking differently: Take 25 mg by mouth 2 (two) times daily.) 60 tablet 0   pravastatin (PRAVACHOL) 20 MG tablet Take 20 mg by mouth at bedtime.     Semaglutide (RYBELSUS PO) Take 14 mg by mouth daily.     apixaban (ELIQUIS) 5 MG TABS tablet Take 5 mg by mouth 2 (two) times daily.     aspirin EC 81 MG tablet Take 81 mg by mouth daily. Reported on 05/15/2015 (Patient not taking: Reported on 07/05/2022)     furosemide (LASIX) 20 MG tablet Take 20 mg by mouth daily.     loratadine (CLARITIN) 10 MG tablet Take 10 mg by mouth daily. (Patient not taking: Reported on 01/19/2022)     losartan (COZAAR) 25 MG tablet Take 25 mg by mouth daily. (Patient not taking: Reported on 01/19/2022)     metFORMIN (GLUCOPHAGE-XR) 500 MG 24 hr tablet Take 500 mg by mouth daily with breakfast.  (Patient not taking: Reported on 01/19/2022)     nitroGLYCERIN (NITROSTAT) 0.4 MG SL tablet Place 1 tablet (0.4 mg total) under the tongue every 5 (five) minutes as needed for chest pain. (Patient not taking: Reported on 01/19/2022) 30 tablet 0   omeprazole (PRILOSEC) 20 MG capsule Take 20 mg by mouth daily.      Pertinent medications related to GI and procedure were reviewed by me with the patient prior to the procedure   Current Facility-Administered Medications:    0.9 %  sodium chloride infusion, , Intravenous, Continuous, Annamaria Helling, DO, Last Rate: 20 mL/hr at 07/05/22 1018, Restarted at 07/05/22 1038      Allergies  Allergen Reactions   Amoxicillin Hives   Iodinated Contrast Media Other (See Comments)   Lipitor [Atorvastatin]    Plavix [Clopidogrel] Hives   Allergies were reviewed by me prior to the procedure  Objective   Body mass index is 25.68 kg/m. Vitals:   07/05/22 1006  BP: 134/86  Pulse: 86  Resp: 18  Temp: (!) 96.7 F (35.9 C)  TempSrc: Temporal  SpO2: 99%  Weight: 90.7 kg  Height: 6\' 2"  (1.88  m)     Physical Exam Vitals and nursing note reviewed.  Constitutional:      General: Joseph Mann is not in acute distress.    Appearance: Normal appearance. Joseph Mann is not ill-appearing, toxic-appearing or diaphoretic.  HENT:     Head: Normocephalic and atraumatic.     Nose: Nose normal.     Mouth/Throat:     Mouth: Mucous membranes are moist.     Pharynx: Oropharynx is clear.  Eyes:     General: No scleral icterus.    Extraocular Movements: Extraocular movements intact.  Cardiovascular:     Rate and Rhythm: Normal rate and regular rhythm.     Heart sounds: Normal heart sounds. No murmur heard.    No friction rub. No gallop.  Pulmonary:     Effort: Pulmonary effort is normal. No respiratory distress.     Breath sounds: Normal breath sounds. No wheezing, rhonchi or rales.  Abdominal:     General: Bowel sounds are normal. There is no distension.     Palpations: Abdomen is soft.     Tenderness: There is no abdominal tenderness. There is no guarding or rebound.  Musculoskeletal:     Cervical back: Neck supple.     Right lower leg: No edema.     Left lower leg: No edema.  Skin:    General: Skin is warm and dry.     Coloration: Skin is not jaundiced or pale.  Neurological:     General: No focal deficit present.     Mental Status: Joseph Mann is alert and oriented to person, place, and time. Mental status is at baseline.  Psychiatric:        Mood and Affect: Mood normal.        Behavior: Behavior normal.        Thought Content: Thought content normal.        Judgment: Judgment normal.      Assessment:  Joseph Mann is a 82 y.o. male  who presents today for Esophagogastroduodenoscopy for Melena, dyspepsia, dysphagia .  Plan:  Esophagogastroduodenoscopy with possible intervention today  Esophagogastroduodenoscopy with possible biopsy, control of bleeding, polypectomy, and interventions as necessary has been discussed with the patient/patient representative. Informed consent was obtained  from the patient/patient representative after explaining the indication, nature, and risks of the procedure including but not limited to death, bleeding, perforation, missed neoplasm/lesions, cardiorespiratory compromise, and reaction to medications. Opportunity for questions was given and appropriate answers were provided. Patient/patient representative has verbalized understanding is amenable to undergoing the procedure.   Annamaria Helling, DO  [facility]  Portions of the record may have been created with voice recognition software. Occasional wrong-word or 'sound-a-like' substitutions may have occurred due to the inherent limitations of voice recognition software.  Read the chart carefully and recognize, using context, where substitutions may have occurred.

## 2022-07-05 ENCOUNTER — Ambulatory Visit: Payer: Federal, State, Local not specified - PPO | Admitting: Certified Registered Nurse Anesthetist

## 2022-07-05 ENCOUNTER — Encounter: Payer: Self-pay | Admitting: Gastroenterology

## 2022-07-05 ENCOUNTER — Encounter
Admission: RE | Disposition: A | Discharge status: Home / Self Care | Payer: Self-pay | Source: Ambulatory Visit | Attending: Gastroenterology

## 2022-07-05 ENCOUNTER — Ambulatory Visit
Admission: RE | Admit: 2022-07-05 | Discharge: 2022-07-05 | Disposition: A | Discharge status: Home / Self Care | Payer: Federal, State, Local not specified - PPO | Source: Ambulatory Visit | Attending: Gastroenterology | Admitting: Gastroenterology

## 2022-07-05 ENCOUNTER — Other Ambulatory Visit: Payer: Self-pay

## 2022-07-05 DIAGNOSIS — E119 Type 2 diabetes mellitus without complications: Secondary | ICD-10-CM | POA: Diagnosis not present

## 2022-07-05 DIAGNOSIS — Z9049 Acquired absence of other specified parts of digestive tract: Secondary | ICD-10-CM | POA: Diagnosis not present

## 2022-07-05 DIAGNOSIS — K5710 Diverticulosis of small intestine without perforation or abscess without bleeding: Secondary | ICD-10-CM | POA: Diagnosis not present

## 2022-07-05 DIAGNOSIS — K921 Melena: Secondary | ICD-10-CM | POA: Diagnosis not present

## 2022-07-05 DIAGNOSIS — K317 Polyp of stomach and duodenum: Secondary | ICD-10-CM | POA: Diagnosis not present

## 2022-07-05 DIAGNOSIS — I2510 Atherosclerotic heart disease of native coronary artery without angina pectoris: Secondary | ICD-10-CM | POA: Insufficient documentation

## 2022-07-05 DIAGNOSIS — F32A Depression, unspecified: Secondary | ICD-10-CM | POA: Insufficient documentation

## 2022-07-05 DIAGNOSIS — K224 Dyskinesia of esophagus: Secondary | ICD-10-CM | POA: Insufficient documentation

## 2022-07-05 DIAGNOSIS — Z7984 Long term (current) use of oral hypoglycemic drugs: Secondary | ICD-10-CM | POA: Insufficient documentation

## 2022-07-05 DIAGNOSIS — K449 Diaphragmatic hernia without obstruction or gangrene: Secondary | ICD-10-CM | POA: Insufficient documentation

## 2022-07-05 DIAGNOSIS — Z955 Presence of coronary angioplasty implant and graft: Secondary | ICD-10-CM | POA: Insufficient documentation

## 2022-07-05 DIAGNOSIS — I252 Old myocardial infarction: Secondary | ICD-10-CM | POA: Insufficient documentation

## 2022-07-05 DIAGNOSIS — I10 Essential (primary) hypertension: Secondary | ICD-10-CM | POA: Insufficient documentation

## 2022-07-05 DIAGNOSIS — K219 Gastro-esophageal reflux disease without esophagitis: Secondary | ICD-10-CM | POA: Insufficient documentation

## 2022-07-05 DIAGNOSIS — R1013 Epigastric pain: Secondary | ICD-10-CM | POA: Diagnosis present

## 2022-07-05 DIAGNOSIS — R1310 Dysphagia, unspecified: Secondary | ICD-10-CM | POA: Diagnosis present

## 2022-07-05 DIAGNOSIS — Z7901 Long term (current) use of anticoagulants: Secondary | ICD-10-CM | POA: Insufficient documentation

## 2022-07-05 HISTORY — DX: Atherosclerotic heart disease of native coronary artery without angina pectoris: I25.10

## 2022-07-05 HISTORY — DX: Abdominal aortic aneurysm, without rupture, unspecified: I71.40

## 2022-07-05 HISTORY — DX: Unspecified atrial fibrillation: I48.91

## 2022-07-05 HISTORY — DX: Depression, unspecified: F32.A

## 2022-07-05 HISTORY — DX: Cardiac arrhythmia, unspecified: I49.9

## 2022-07-05 HISTORY — PX: ESOPHAGOGASTRODUODENOSCOPY (EGD) WITH PROPOFOL: SHX5813

## 2022-07-05 LAB — GLUCOSE, CAPILLARY: Glucose-Capillary: 144 mg/dL — ABNORMAL HIGH (ref 70–99)

## 2022-07-05 SURGERY — ESOPHAGOGASTRODUODENOSCOPY (EGD) WITH PROPOFOL
Anesthesia: General

## 2022-07-05 MED ORDER — PROPOFOL 500 MG/50ML IV EMUL
INTRAVENOUS | Status: DC | PRN
  Administered 2022-07-05: 150 ug/kg/min via INTRAVENOUS

## 2022-07-05 MED ORDER — PROPOFOL 10 MG/ML IV BOLUS
INTRAVENOUS | Status: DC | PRN
  Administered 2022-07-05: 70 mg via INTRAVENOUS

## 2022-07-05 MED ORDER — SODIUM CHLORIDE 0.9 % IV SOLN: INTRAVENOUS | Status: DC

## 2022-07-05 MED ORDER — LIDOCAINE HCL (CARDIAC) PF 100 MG/5ML IV SOSY
PREFILLED_SYRINGE | INTRAVENOUS | Status: DC | PRN
  Administered 2022-07-05: 50 mg via INTRAVENOUS

## 2022-07-05 NOTE — Anesthesia Preprocedure Evaluation (Signed)
Anesthesia Evaluation  Patient identified by MRN, date of birth, ID band Patient awake    Reviewed: Allergy & Precautions, NPO status , Patient's Chart, lab work & pertinent test results  History of Anesthesia Complications Negative for: history of anesthetic complications  Airway Mallampati: II  TM Distance: >3 FB Neck ROM: Full    Dental  (+) Poor Dentition, Missing   Pulmonary neg pulmonary ROS, neg sleep apnea, neg COPD, Patient abstained from smoking.Not current smoker   Pulmonary exam normal breath sounds clear to auscultation       Cardiovascular Exercise Tolerance: Good METShypertension, + CAD, + Past MI and + Cardiac Stents  + dysrhythmias  Rhythm:Regular Rate:Normal - Systolic murmurs    Neuro/Psych  Headaches PSYCHIATRIC DISORDERS  Depression       GI/Hepatic ,GERD  Medicated and Controlled,,(+)     (-) substance abuse    Endo/Other  diabetes  Daily oral GLP1 held > 1 day  Renal/GU negative Renal ROS     Musculoskeletal   Abdominal   Peds  Hematology   Anesthesia Other Findings Past Medical History: No date: AAA (abdominal aortic aneurysm) (HCC) No date: Atrial fibrillation (HCC) No date: BPH (benign prostatic hyperplasia) No date: Coronary artery disease No date: Depression No date: Diabetes mellitus without complication (HCC) No date: Dysrhythmia No date: GERD (gastroesophageal reflux disease) No date: Headache     Comment:  migraines No date: Hyperlipidemia No date: Hypertension No date: MI (myocardial infarction) ([place])  Reproductive/Obstetrics                             Anesthesia Physical Anesthesia Plan  ASA: 3  Anesthesia Plan: General   Post-op Pain Management: Minimal or no pain anticipated   Induction: Intravenous  PONV Risk Score and Plan: 3 and Propofol infusion, TIVA and Ondansetron  Airway Management Planned: Nasal Cannula  Additional  Equipment: None  Intra-op Plan:   Post-operative Plan:   Informed Consent: I have reviewed the patients History and Physical, chart, labs and discussed the procedure including the risks, benefits and alternatives for the proposed anesthesia with the patient or authorized representative who has indicated his/her understanding and acceptance.     Dental advisory given  Plan Discussed with: CRNA and Surgeon  Anesthesia Plan Comments: (Discussed risks of anesthesia with patient, including possibility of difficulty with spontaneous ventilation under anesthesia necessitating airway intervention, PONV, and rare risks such as cardiac or respiratory or neurological events, and allergic reactions. Discussed the role of CRNA in patient's perioperative care. Patient understands.)       Anesthesia Quick Evaluation

## 2022-07-05 NOTE — Anesthesia Postprocedure Evaluation (Signed)
Anesthesia Post Note  Patient: Joseph Mann  Procedure(s) Performed: ESOPHAGOGASTRODUODENOSCOPY (EGD) WITH PROPOFOL  Patient location during evaluation: Endoscopy Anesthesia Type: General Level of consciousness: awake and alert Pain management: pain level controlled Vital Signs Assessment: post-procedure vital signs reviewed and stable Respiratory status: spontaneous breathing, nonlabored ventilation, respiratory function stable and patient connected to nasal cannula oxygen Cardiovascular status: blood pressure returned to baseline and stable Postop Assessment: no apparent nausea or vomiting Anesthetic complications: no   No notable events documented.   Last Vitals:  Vitals:   07/05/22 1143 07/05/22 1153  BP: 101/80 (!) 128/97  Pulse: 92 75  Resp: 17 16  Temp:    SpO2: 97% 98%    Last Pain:  Vitals:   07/05/22 1153  TempSrc:   PainSc: 0-No pain                 Arita Miss

## 2022-07-05 NOTE — Anesthesia Procedure Notes (Signed)
Date/Time: 07/05/2022 11:14 AM  Performed by: Johnna Acosta, CRNAPre-anesthesia Checklist: Patient identified, Emergency Drugs available, Suction available, Patient being monitored and Timeout performed Patient Re-evaluated:Patient Re-evaluated prior to induction Oxygen Delivery Method: Nasal cannula Preoxygenation: Pre-oxygenation with 100% oxygen Induction Type: IV induction

## 2022-07-05 NOTE — Op Note (Signed)
[facility] Gastroenterology Patient Name: Joseph Mann Procedure Date: 07/05/2022 11:12 AM MRN: [id number] Account #: [id number] Date of Birth: 01/14/1941 Admit Type: Outpatient Age: 82 Room: [facility] ENDO ROOM 2 Gender: Male Note Status: Finalized Instrument Name: Michaelle Birks [id number] Procedure:             Upper GI endoscopy Indications:           Dysphagia Providers:             Rueben Bash, DO Referring MD:          Ocie Cornfield. Ouida Sills MD, MD (Referring MD) Medicines:             Monitored Anesthesia Care Complications:         No immediate complications. Estimated blood loss:                         Minimal. Procedure:             Pre-Anesthesia Assessment:                        - Prior to the procedure, a History and Physical was                         performed, and patient medications and allergies were                         reviewed. The patient is competent. The risks and                         benefits of the procedure and the sedation options and                         risks were discussed with the patient. All questions                         were answered and informed consent was obtained.                         Patient identification and proposed procedure were                         verified by the physician, the nurse, the anesthetist                         and the technician in the endoscopy suite. Mental                         Status Examination: alert and oriented. Airway                         Examination: normal oropharyngeal airway and neck                         mobility. Respiratory Examination: clear to                         auscultation. CV Examination: RRR, no murmurs, no S3  or S4. Prophylactic Antibiotics: The patient does not                         require prophylactic antibiotics. Prior                         Anticoagulants: The patient has taken Eliquis                          (apixaban), last dose was 4 days prior to procedure.                         ASA Grade Assessment: III - A patient with severe                         systemic disease. After reviewing the risks and                         benefits, the patient was deemed in satisfactory                         condition to undergo the procedure. The anesthesia                         plan was to use monitored anesthesia care (MAC).                         Immediately prior to administration of medications,                         the patient was re-assessed for adequacy to receive                         sedatives. The heart rate, respiratory rate, oxygen                         saturations, blood pressure, adequacy of pulmonary                         ventilation, and response to care were monitored                         throughout the procedure. The physical status of the                         patient was re-assessed after the procedure.                        After obtaining informed consent, the endoscope was                         passed under direct vision. Throughout the procedure,                         the patient's blood pressure, pulse, and oxygen                         saturations were monitored continuously. The Endoscope  was introduced through the mouth, and advanced to the                         second part of duodenum. The upper GI endoscopy was                         accomplished without difficulty. The patient tolerated                         the procedure well. Findings:      The duodenal bulb, first portion of the duodenum and second portion of       the duodenum were normal. Estimated blood loss: none.      A single 1 to 2 mm sessile polyp with no bleeding and no stigmata of       recent bleeding was found on the greater curvature of the stomach.       Biopsies were taken with a cold forceps for histology. Estimated blood       loss was minimal.       A 2 cm hiatal hernia was present. Estimated blood loss: none.      The Z-line was regular. Estimated blood loss: none.      Esophagogastric landmarks were identified: the gastroesophageal junction       was found at 38 cm from the incisors.      Abnormal motility was noted in the esophagus. The cricopharyngeus was       normal. There is spasticity of the esophageal body. The distal       esophagus/lower esophageal sphincter is open. Tertiary peristaltic waves       are noted. The scope was withdrawn. Dilation was performed with a       Maloney dilator with no resistance at 52 Fr. The dilation site was       examined following endoscope reinsertion and showed no change. Estimated       blood loss: none.      Normal mucosa was found in the entire esophagus. Biopsies were taken       with a cold forceps for histology. Taken from distal and proximal       esophagus. Estimated blood loss was minimal.      The exam of the esophagus was otherwise normal.      A medium non-bleeding diverticulum was found in the second portion of       the duodenum. Estimated blood loss: none. Impression:            - Normal duodenal bulb, first portion of the duodenum                         and second portion of the duodenum.                        - A single gastric polyp. Biopsied.                        - 2 cm hiatal hernia.                        - Z-line regular.                        - Esophagogastric landmarks  identified.                        - Abnormal esophageal motility, consistent with                         presbyesophagus. Dilated.                        - Normal mucosa was found in the entire esophagus.                         Biopsied.                        - Non-bleeding duodenal diverticulum. Recommendation:        - Patient has a contact number available for                         emergencies. The signs and symptoms of potential                         delayed complications were  discussed with the patient.                         Return to normal activities tomorrow. Written                         discharge instructions were provided to the patient.                        - Discharge patient to home.                        - Moisten and chew food well. Small bites.                        - Continue present medications.                        - Resume Eliquis (apixaban) at prior dose tomorrow.                         Refer to referring physician for further adjustment of                         therapy.                        - Return to GI clinic as previously scheduled.                        - The findings and recommendations were discussed with                         the patient.                        - The findings and recommendations were discussed with                         the patient's family.  Procedure Code(s):     --- Professional ---                        [phone], Esophagogastroduodenoscopy, flexible,                         transoral; with biopsy, single or multiple                        43450, Dilation of esophagus, by unguided sound or                         bougie, single or multiple passes Diagnosis Code(s):     --- Professional ---                        K31.7, Polyp of stomach and duodenum                        K44.9, Diaphragmatic hernia without obstruction or                         gangrene                        K22.4, Dyskinesia of esophagus                        R13.10, Dysphagia, unspecified CPT copyright 2022 American Medical Association. All rights reserved. The codes documented in this report are preliminary and upon coder review may  be revised to meet current compliance requirements. Attending Participation:      I personally performed the entire procedure. Volney American, DO Annamaria Helling DO, DO 07/05/2022 11:32:27 AM This report has been signed electronically. Number of Addenda: 0 Note Initiated On: 07/05/2022  11:12 AM Estimated Blood Loss:  Estimated blood loss was minimal.      [facility]

## 2022-07-05 NOTE — Transfer of Care (Signed)
Immediate Anesthesia Transfer of Care Note  Patient: Joseph Mann  Procedure(s) Performed: ESOPHAGOGASTRODUODENOSCOPY (EGD) WITH PROPOFOL  Patient Location: Endoscopy Unit  Anesthesia Type:General  Level of Consciousness: sedated  Airway & Oxygen Therapy: Patient Spontanous Breathing  Post-op Assessment: Report given to RN and Post -op Vital signs reviewed and stable  Post vital signs: Reviewed and stable  Last Vitals:  Vitals Value Taken Time  BP 91/66 07/05/22 1133  Temp 35.9 C 07/05/22 1133  Pulse 90 07/05/22 1136  Resp 33 07/05/22 1136  SpO2 98 % 07/05/22 1136  Vitals shown include unvalidated device data.  Last Pain:  Vitals:   07/05/22 1133  TempSrc: Temporal  PainSc: 0-No pain         Complications: No notable events documented.

## 2022-07-05 NOTE — Interval H&P Note (Signed)
History and Physical Interval Note: Preprocedure H&P from 07/05/22  was reviewed and there was no interval change after seeing and examining the patient.  Written consent was obtained from the patient after discussion of risks, benefits, and alternatives. Patient has consented to proceed with Esophagogastroduodenoscopy with possible intervention   07/05/2022 11:15 AM  Joseph Mann  has presented today for surgery, with the diagnosis of melena ,dyspepsia,esophageal dysphagia.  The various methods of treatment have been discussed with the patient and family. After consideration of risks, benefits and other options for treatment, the patient has consented to  Procedure(s): ESOPHAGOGASTRODUODENOSCOPY (EGD) WITH PROPOFOL (N/A) as a surgical intervention.  The patient's history has been reviewed, patient examined, no change in status, stable for surgery.  I have reviewed the patient's chart and labs.  Questions were answered to the patient's satisfaction.     Annamaria Helling

## 2022-07-06 ENCOUNTER — Encounter: Payer: Self-pay | Admitting: Gastroenterology

## 2022-07-06 LAB — SURGICAL PATHOLOGY

## 2022-11-01 ENCOUNTER — Other Ambulatory Visit: Payer: Self-pay | Admitting: Gastroenterology

## 2022-11-01 DIAGNOSIS — R1013 Epigastric pain: Secondary | ICD-10-CM

## 2022-11-01 DIAGNOSIS — R140 Abdominal distension (gaseous): Secondary | ICD-10-CM

## 2022-11-05 ENCOUNTER — Ambulatory Visit
Admission: RE | Admit: 2022-11-05 | Discharge: 2022-11-05 | Disposition: A | Discharge status: Home / Self Care | Payer: Federal, State, Local not specified - PPO | Source: Ambulatory Visit | Attending: Gastroenterology | Admitting: Gastroenterology

## 2022-11-05 DIAGNOSIS — R140 Abdominal distension (gaseous): Secondary | ICD-10-CM | POA: Insufficient documentation

## 2022-11-05 DIAGNOSIS — R1013 Epigastric pain: Secondary | ICD-10-CM | POA: Insufficient documentation

## 2022-11-05 MED ORDER — TECHNETIUM TC 99M SULFUR COLLOID
2.0500 | Freq: Once | INTRAVENOUS | Status: AC | PRN
  Administered 2022-11-05: 2.05 via ORAL

## 2023-01-17 ENCOUNTER — Other Ambulatory Visit: Payer: Self-pay | Admitting: Gastroenterology

## 2023-01-17 DIAGNOSIS — R1013 Epigastric pain: Secondary | ICD-10-CM

## 2023-01-17 DIAGNOSIS — R194 Change in bowel habit: Secondary | ICD-10-CM

## 2023-01-17 DIAGNOSIS — R1084 Generalized abdominal pain: Secondary | ICD-10-CM

## 2023-01-20 ENCOUNTER — Ambulatory Visit
Admission: RE | Admit: 2023-01-20 | Discharge: 2023-01-20 | Disposition: A | Discharge status: Home / Self Care | Payer: Federal, State, Local not specified - PPO | Source: Ambulatory Visit | Attending: Gastroenterology | Admitting: Gastroenterology

## 2023-01-20 DIAGNOSIS — R194 Change in bowel habit: Secondary | ICD-10-CM | POA: Diagnosis present

## 2023-01-20 DIAGNOSIS — R1013 Epigastric pain: Secondary | ICD-10-CM | POA: Diagnosis present

## 2023-01-20 DIAGNOSIS — R1084 Generalized abdominal pain: Secondary | ICD-10-CM | POA: Insufficient documentation

## 2023-01-21 ENCOUNTER — Other Ambulatory Visit: Payer: Self-pay

## 2023-01-28 ENCOUNTER — Ambulatory Visit: Admission: RE | Admit: 2023-01-28 | Payer: Federal, State, Local not specified - PPO | Source: Home / Self Care

## 2023-01-28 SURGERY — COLONOSCOPY WITH PROPOFOL
Anesthesia: General

## 2023-03-03 ENCOUNTER — Ambulatory Visit: Payer: Federal, State, Local not specified - PPO | Admitting: Urology

## 2023-03-03 VITALS — BP 115/78 | HR 102 | Ht 74.0 in | Wt 195.2 lb

## 2023-03-03 DIAGNOSIS — N1339 Other hydronephrosis: Secondary | ICD-10-CM | POA: Diagnosis not present

## 2023-03-03 NOTE — Progress Notes (Signed)
Marcelle Overlie Plume,acting as a scribe for Vanna Scotland, MD.,have documented all relevant documentation on the behalf of Vanna Scotland, MD,as directed by  Vanna Scotland, MD while in the presence of Vanna Scotland, MD.  03/03/2023 10:16 AM   Larose Hires [birth year] [id number]  Referring provider: Harl Favor, NP No address on file  Chief Complaint  Patient presents with   Establish Care   f/u imaging scan results    HPI: 82 year-old male who is referred for further evaluation of incidental abnormal CT scan.   He underwent a CT abdomen pelvis without contrast no 01/27/2023 ordered by a GI nurse practitioner. This showed an incidental finding of moderate right hydronephrosis with a caliber change at the UPJ concerning for UPJ obstruction. There are likely parapelvic cysts on the left side. Notably, he did have a lumbar MRI in 2023, which showed a similar degree of hydronephrosis present on the right.   His most recent creatinine on 01/03/2023 was 0.9.   Today, he reports no pain in the right kidney, no blood in urine, and no urinary issues. He mentions a weaker urine stream at night but feels the bladder is emptied.   He has had two shots in the back for sciatic nerve pain, with the last one being recent. He is considering taking gabapentin for sciatic pain.    PMH: Past Medical History:  Diagnosis Date   AAA (abdominal aortic aneurysm) (HCC)    Atrial fibrillation (HCC)    BPH (benign prostatic hyperplasia)    Coronary artery disease    Depression    Diabetes mellitus without complication (HCC)    Dysrhythmia    GERD (gastroesophageal reflux disease)    Headache    migraines   Hyperlipidemia    Hypertension    MI (myocardial infarction) [facility])     Surgical History: Past Surgical History:  Procedure Laterality Date   BACK SURGERY     CARDIAC CATHETERIZATION Left 05/15/2015   Procedure: Left Heart Cath and Coronary Angiography;  Surgeon: Alwyn Pea, MD;   Location: [facility] INVASIVE CV LAB;  Service: Cardiovascular;  Laterality: Left;   CHOLECYSTECTOMY     COLONOSCOPY N/A 03/10/2015   Procedure: COLONOSCOPY;  Surgeon: Scot Jun, MD;  Location: [facility] ENDOSCOPY;  Service: Endoscopy;  Laterality: N/A;   IDDM  Vancomycin/ Gentamycin Preop   CORONARY ANGIOPLASTY     ESOPHAGOGASTRODUODENOSCOPY (EGD) WITH PROPOFOL N/A 05/22/2015   Procedure: ESOPHAGOGASTRODUODENOSCOPY (EGD) WITH PROPOFOL;  Surgeon: Scot Jun, MD;  Location: [facility] ENDOSCOPY;  Service: Endoscopy;  Laterality: N/A;   ESOPHAGOGASTRODUODENOSCOPY (EGD) WITH PROPOFOL N/A 07/05/2022   Procedure: ESOPHAGOGASTRODUODENOSCOPY (EGD) WITH PROPOFOL;  Surgeon: Jaynie Collins, DO;  Location: [facility] ENDOSCOPY;  Service: Endoscopy;  Laterality: N/A;   HERNIA REPAIR     REPLACEMENT TOTAL KNEE     RIGHT/LEFT HEART CATH AND CORONARY ANGIOGRAPHY N/A 01/19/2022   Procedure: RIGHT/LEFT HEART CATH AND CORONARY ANGIOGRAPHY;  Surgeon: Alwyn Pea, MD;  Location: [facility] INVASIVE CV LAB;  Service: Cardiovascular;  Laterality: N/A;    Home Medications:  Allergies as of 03/03/2023       Reactions   Amoxicillin Hives   Iodinated Contrast Media Other (See Comments)   Lipitor [atorvastatin]    Plavix [clopidogrel] Hives        Medication List        Accurate as of March 03, 2023 10:16 AM. If you have any questions, ask your nurse or doctor.  aspirin EC 81 MG tablet Take 81 mg by mouth daily. Reported on 05/15/2015   Budesonide 90 MCG/ACT inhaler Inhale 2 puffs into the lungs daily.   Eliquis 5 MG Tabs tablet Generic drug: apixaban Take 5 mg by mouth 2 (two) times daily.   escitalopram 20 MG tablet Commonly known as: LEXAPRO Take 20 mg by mouth daily.   furosemide 20 MG tablet Commonly known as: LASIX Take 20 mg by mouth daily.   Jardiance 25 MG Tabs tablet Generic drug: empagliflozin Take 25 mg by mouth daily.   loratadine 10 MG tablet Commonly known as:  CLARITIN Take 10 mg by mouth daily.   losartan 25 MG tablet Commonly known as: COZAAR Take 25 mg by mouth daily.   metFORMIN 500 MG 24 hr tablet Commonly known as: GLUCOPHAGE-XR Take 500 mg by mouth daily with breakfast.   metoprolol tartrate 50 MG tablet Commonly known as: Lopressor Take 1 tablet (50 mg total) by mouth 2 (two) times daily. What changed: how much to take   nitroGLYCERIN 0.4 MG SL tablet Commonly known as: NITROSTAT Place 1 tablet (0.4 mg total) under the tongue every 5 (five) minutes as needed for chest pain.   omeprazole 20 MG capsule Commonly known as: PRILOSEC Take 20 mg by mouth daily.   pravastatin 20 MG tablet Commonly known as: PRAVACHOL Take 20 mg by mouth at bedtime.   RYBELSUS PO Take 14 mg by mouth daily.        Allergies:  Allergies  Allergen Reactions   Amoxicillin Hives   Iodinated Contrast Media Other (See Comments)   Lipitor [Atorvastatin]    Plavix [Clopidogrel] Hives     Social History:  reports that he has never smoked. He has never used smokeless tobacco. He reports that he does not drink alcohol and does not use drugs.   Physical Exam: BP 115/78   Pulse (!) 102   Ht 6\' 2"  (1.88 m)   Wt 195 lb 4 oz (88.6 kg)   BMI 25.07 kg/m   Constitutional:  Alert and oriented, No acute distress. HEENT: Helix AT, moist mucus membranes.  Trachea midline, no masses. Neurologic: Grossly intact, no focal deficits, moving all 4 extremities. Psychiatric: Normal mood and affect.   Pertinent Imaging: EXAM: CT ABDOMEN AND PELVIS WITHOUT CONTRAST   TECHNIQUE: Multidetector CT imaging of the abdomen and pelvis was performed following the standard protocol without IV contrast.   RADIATION DOSE REDUCTION: This exam was performed according to the departmental dose-optimization program which includes automated exposure control, adjustment of the mA and/or kV according to patient size and/or use of iterative reconstruction technique.    COMPARISON:  CT abdomen and pelvis dated June 10, 2017; MRI lumbar spine dated October 04, 2021.   FINDINGS: Lower chest: Small hiatal hernia.  No acute abnormality.   Hepatobiliary: No focal liver abnormality is seen. Status post cholecystectomy. No biliary dilatation.   Pancreas: Unremarkable. No pancreatic ductal dilatation or surrounding inflammatory changes.   Spleen: Normal in size without focal abnormality.   Adrenals/Urinary Tract: Bilateral adrenal glands are unremarkable. New moderate right and unchanged mild left hydronephrosis with abrupt caliber change of the ureteropelvic junctions. No nephroureterolithiasis.   Stomach/Bowel: Stomach is within normal limits. Duodenal diverticulum. Appendix appears normal. Diverticulosis. No evidence of bowel wall thickening, distention, or inflammatory changes.   Vascular/Lymphatic: Abdominal aortic aneurysm measuring 3.4 cm, unchanged when compared with the prior. Aortic atherosclerosis. No enlarged abdominal or pelvic lymph nodes.   Reproductive: Prostate is unremarkable.  Other: No abdominal wall hernia or abnormality. No abdominopelvic ascites.   Musculoskeletal: No acute or significant osseous findings.   IMPRESSION: 1. No acute findings in the abdomen or pelvis. 2. Moderate right and unchanged mild left hydronephrosis with abrupt caliber change of the ureteropelvic junctions unchanged when compared with the prior CT, findings appear similar to June 25th 2023 MRI. 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 3. Unchanged abdominal aortic aneurysm measuring 3.4 cm. Recommend follow-up ultrasound every 3 years. (Ref.: J Vasc Surg. 2018; 67:2-77 and J Am Coll Radiol 2013;10(10):789-794.) 4. Aortic Atherosclerosis (ICD10-I70.0).     Electronically Signed   By: Allegra Lai M.D.   On: 01/27/2023 12:44  This was personally reviewed and I agree with the radiologic interpretation. Notably,  his bladder was remarkably distended on this study.    EXAM: CT ABDOMEN AND PELVIS WITHOUT CONTRAST   TECHNIQUE: Multidetector CT imaging of the abdomen and pelvis was performed following the standard protocol without IV contrast.   COMPARISON:  CT dated 07/13/2012 and abdominal ultrasound dated 07/13/2012   FINDINGS: Evaluation of this exam is limited in the absence of intravenous contrast.   Lower chest: Minimal bibasilar atelectatic changes. Multi vessel coronary vascular calcification.   No intra-abdominal free air or free fluid.   Hepatobiliary: Cholecystectomy. The liver is unremarkable. No intrahepatic biliary ductal dilatation.   Pancreas: Unremarkable. No pancreatic ductal dilatation or surrounding inflammatory changes.   Spleen: Normal in size without focal abnormality.   Adrenals/Urinary Tract: The adrenal glands are unremarkable. There is no hydronephrosis or nephrolithiasis on either side. Multiple small bilateral parapelvic cysts. The visualized ureters and urinary bladder appear unremarkable.   Stomach/Bowel: There is a 15 mm duodenal diverticulum. There is scattered sigmoid diverticula without active inflammatory changes. No bowel obstruction or active inflammation. A small hiatal hernia. The appendix is normal.   Vascular/Lymphatic: Advanced aortoiliac atherosclerotic disease. 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 evaluation of the vasculature is limited in the absence of intravenous contrast. Dilatation of the common iliac arteries bilaterally measure up to 1.7 cm on the left. No portal venous gas. There is no adenopathy.   Reproductive: The prostate and seminal vesicles are grossly unremarkable.   Other: None   Musculoskeletal: Osteopenia with degenerative changes of the spine. Multilevel disc desiccation and vacuum phenomena. No acute osseous pathology.   IMPRESSION: 1. No acute  intra-abdominal or pelvic pathology. Colonic diverticulosis. No bowel obstruction or active inflammation. Normal appendix. 2. A 3.4 cm infrarenal aortic aneurysm. Recommend followup by ultrasound in 3 years. This recommendation follows ACR consensus guidelines: White Paper of the ACR Incidental Findings Committee II on Vascular Findings. J Am Coll Radiol 2013; 16:109-604 3.  Aortic Atherosclerosis (ICD10-I70.0).     Electronically Signed   By: Elgie Collard M.D.   On: 06/10/2017 04:28  This was personally reviewed and I agree with the radiologic interpretation. This shows similar probable left parapelvic cyst and some fullness versus extra renal pelvis on the right, but no overt hydronephrosis.    Assessment & Plan:    1. 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 Ensure the bladder is relatively empty during the scan to get an accurate assessment. - If the CT scan confirms a UPJ obstruction without concerning features, manage conservatively given  his age and stable kidney function, follow with serial imaging and labs -Asymptomatic -Agrees with more conservative approach  Return in about 6 months (around 08/31/2023) for labs and ultrasound to monitor kidney function and hydronephrosis.  (Will call with CT urogram results)  I have reviewed the above documentation for accuracy and completeness, and I agree with the above.   Vanna Scotland, MD   [facility] [address], [address] [place], [place] [address] [phone]

## 2023-03-17 ENCOUNTER — Encounter: Payer: Self-pay | Admitting: Urology

## 2023-03-17 ENCOUNTER — Other Ambulatory Visit: Payer: Self-pay | Admitting: Urology

## 2023-03-17 MED ORDER — PREDNISONE 50 MG PO TABS: ORAL_TABLET | ORAL | 0 refills | Status: DC

## 2023-03-17 MED ORDER — DIPHENHYDRAMINE HCL 50 MG PO TABS: ORAL_TABLET | ORAL | 0 refills | Status: DC

## 2023-03-18 ENCOUNTER — Ambulatory Visit
Admission: RE | Admit: 2023-03-18 | Discharge: 2023-03-18 | Disposition: A | Discharge status: Home / Self Care | Payer: Federal, State, Local not specified - PPO | Source: Ambulatory Visit | Attending: Urology | Admitting: Urology

## 2023-03-18 DIAGNOSIS — N1339 Other hydronephrosis: Secondary | ICD-10-CM | POA: Diagnosis present

## 2023-03-18 LAB — POCT I-STAT CREATININE: Creatinine, Ser: 1 mg/dL (ref 0.61–1.24)

## 2023-03-18 MED ORDER — IOHEXOL 300 MG/ML  SOLN
100.0000 mL | Freq: Once | INTRAMUSCULAR | Status: AC | PRN
  Administered 2023-03-18: 100 mL via INTRAVENOUS

## 2023-03-29 NOTE — Addendum Note (Signed)
Addended by: Consuella Lose on: 03/29/2023 04:43 PM   Modules accepted: Orders

## 2023-05-20 NOTE — Progress Notes (Signed)
 Established Patient Visit   Chief Complaint: Chief Complaint  Patient presents with  .  1 year follow up   Date of Service: 05/18/2023 Date of Birth: [birth year] PCP: Lenon Layman Tanda DOUGLAS, MD  History of Present Illness: Joseph Mann is a 83 y.o.male patient who states to be doing reasonably well not getting any worse patient unable to tolerate metformin no worse since stent was placed denies any worsening chest pain states to still be fairly active no recent palpitations or tachycardia  Past Medical and Surgical History  Past Medical History Past Medical History:  Diagnosis Date  . BPH (benign prostatic hypertrophy)   . Diabetes mellitus type 2, uncomplicated (CMS/HHS-HCC)   . Hx of adenomatous colonic polyps 01/30/2015  . Hyperglycemia   . Hyperlipidemia   . Hypertension   . Migraine    HX    Past Surgical History He has a past surgical history that includes ELBOW SURGERY FOR RADIAL NERVE ENTRAPMENT (Left); LUMBAR SURGERY; Cholecystectomy; Hernia repair (Bilateral); Colonoscopy (07/25/2006); Left total knee arthroplasty using computer-assisted navigation (Left, 06/19/2014); Colonoscopy (11/24/2009); Cholecystectomy; PCI AND STENT PLACEMENT; Colonoscopy (03/10/2015); egd (05/22/2015); other surgery (01/19/2022); and EGD @ [facility] (07/05/2022).   Medications and Allergies  Current Medications  Current Outpatient Medications  Medication Sig Dispense Refill  . ELIQUIS  5 mg tablet TAKE 1 TABLET EVERY 12     HOURS 180 tablet 3  . FUROsemide (LASIX) 20 MG tablet take 1 tablet by mouth every day 90 tablet 0  . gabapentin (NEURONTIN) 100 MG capsule 1 po qHS x 4 days, then bid x 4 days, then tid 90 capsule 5  . JARDIANCE 25 mg tablet take 1 tablet once daily 90 tablet 1  . metoprolol  succinate (TOPROL -XL) 50 MG XL tablet TAKE 1 TABLET BY MOUTH EVERY DAY (Patient taking differently: TAKES 1/2 TAB 25 MG) 90 tablet 1  . metroNIDAZOLE (METROCREAM) 0.75 % cream APPLY TO FACE DAILY AS  DIRECTED    . pantoprazole (PROTONIX) 40 MG DR tablet Take 1 tablet (40 mg total) by mouth 2 (two) times daily before meals 180 tablet 3  . pravastatin  (PRAVACHOL ) 20 MG tablet TAKE 1 TABLET BY MOUTH EVERY DAY EVERY NIGHT 90 tablet 3  . albuterol 90 mcg/actuation inhaler Inhale 2 inhalations into the lungs every 4 (four) hours as needed for Wheezing 1 each 2   No current facility-administered medications for this visit.    Allergies: Clopidogrel, Altoprev [lovastatin], Amoxicillin, Iodinated contrast media, Lipitor [atorvastatin], Losartan , and Metrizamide  Social and Family History  Social History  reports that he has never smoked. He has never used smokeless tobacco. He reports that he does not drink alcohol and does not use drugs.  Family History Family History  Problem Relation Name Age of Onset  . High blood pressure (Hypertension) Mother    . Myocardial Infarction (Heart attack) Mother    . Gallbladder disease Other      Review of Systems   Review of Systems: The patient denies chest pain, shortness of breath, orthopnea, paroxysmal nocturnal dyspnea, pedal edema, palpitations, heart racing, presyncope, syncope. Review of 12 Systems is negative except as described above.  Physical Examination   Vitals:BP 120/68   Pulse 75   Resp 16   Ht 188 cm (6' 2)   Wt 88.9 kg (196 lb)   SpO2 97%   BMI 25.16 kg/m  Ht:188 cm (6' 2) Wt:88.9 kg (196 lb) ADJ:Anib surface area is 2.15 meters squared. Body mass index is 25.16 kg/m.  HEENT:  Pupils equally reactive to light and accomodation  Neck: Supple without thyromegaly, carotid pulses 2+ Lungs: clear to auscultation bilaterally; no wheezes, rales, rhonchi Heart: Regular rate and rhythm.  No gallops, murmurs or rub Abdomen: soft nontender, nondistended, with normal bowel sounds Extremities: no cyanosis, clubbing, or edema Peripheral Pulses: 2+ in all extremities, 2+ femoral pulses bilaterally Neurologic: Alert and oriented X3;  speech intact; face symmetrical; moves all extremities well  Assessment   83 y.o. male with  1. Atrial fibrillation, unspecified type (CMS/HHS-HCC)   2. Anginal syndrome (CMS-HCC)   3. Mild pulmonary hypertension (CMS/HHS-HCC)   4. PAF (paroxysmal atrial fibrillation) (CMS/HHS-HCC)   5. Coronary artery disease involving native coronary artery of native heart without angina pectoris   6. History of placement of stent in LAD coronary artery   7. Mixed hyperlipidemia   8. Abdominal aortic aneurysm (AAA) without rupture, unspecified part (CMS-HCC)   9. Diabetes mellitus type II, non insulin  dependent (CMS/HHS-HCC)   10. SOB (shortness of breath)   11. Essential hypertension, benign   12. Primary hypertension        Plan  Atrial fibrillation reasonably controlled continue Eliquis  for anticoagulation metoprolol  for rate Hyperlipidemia continue Pravachol  therapy for lipid management Coronary artery disease with angina patient states it is no worse not necessarily better still on metoprolol  not currently on a nitrate Diabetes type 2 uncomplicated currently not on Jardiance diet and exercise Vertigo intermittent recurrent slightly improved no worsening symptoms recently GERD continue Protonix therapy for reflux type symptoms     Return in about 1 year (around 05/17/2024).  Joseph D CALLWOOD, MD  This dictation was prepared with dragon dictation.  Any transcription errors that result from this process are unintentional.

## 2023-06-15 ENCOUNTER — Other Ambulatory Visit: Payer: Self-pay | Admitting: Physical Medicine & Rehabilitation

## 2023-06-15 DIAGNOSIS — M5416 Radiculopathy, lumbar region: Secondary | ICD-10-CM

## 2023-06-27 ENCOUNTER — Ambulatory Visit
Admission: RE | Admit: 2023-06-27 | Discharge: 2023-06-27 | Disposition: A | Discharge status: Home / Self Care | Source: Ambulatory Visit | Attending: Physical Medicine & Rehabilitation | Admitting: Physical Medicine & Rehabilitation

## 2023-06-27 DIAGNOSIS — M5416 Radiculopathy, lumbar region: Secondary | ICD-10-CM

## 2023-07-01 NOTE — Progress Notes (Signed)
 Referring Physician:  Elijah Birk, MD [address] [place],  [place] [address]  Primary Physician:  Lauro Regulus, MD  History of Present Illness: 07/05/2023 History of Present Illness The patient, with a history of diabetes and atrial fibrillation, presents with chronic right lower leg and outer thigh pain that has been ongoing for over a year. The pain is severe enough to limit the patient's mobility, causing difficulty in walking short distances and standing in one place. The patient reports that the pain is relieved when sitting in a recliner. The patient has received three back injections for the pain, the first of which provided relief for over a year, but the subsequent ones were less effective. The patient also reports episodes of weakness in both legs, lightheadedness, dizziness, and shortness of breath. The patient has not taken any medications for the leg pain, except for occasional extra-strength Tylenol when the pain becomes severe.  Bowel/Bladder Dysfunction: none  Conservative measures:  Physical therapy: has not participated in PT Multimodal medical therapy including regular antiinflammatories: Prednisone, Gabapentin Injections: 06/24/2023 Right L5-S1 TESI 04/14/2023 Right L5-S1 TESI 02/17/2023 Right L5-S1 TESI  Past Surgery: R L4/5 surgery many years ago by Dr. Gwenlyn Fudge has no symptoms of cervical myelopathy.  The symptoms are causing a significant impact on the patient's life.   I have utilized the care everywhere function in epic to review the outside records available from external health systems.  Review of Systems:  A 10 point review of systems is negative, except for the pertinent positives and negatives detailed in the HPI.  Past Medical History: Past Medical History:  Diagnosis Date   AAA (abdominal aortic aneurysm) (HCC)    Atrial fibrillation (HCC)    BPH (benign prostatic hyperplasia)    Coronary artery disease     Depression    Diabetes mellitus without complication (HCC)    Dysrhythmia    GERD (gastroesophageal reflux disease)    Headache    migraines   Hyperlipidemia    Hypertension    MI (myocardial infarction) [facility])     Past Surgical History: Past Surgical History:  Procedure Laterality Date   BACK SURGERY     CARDIAC CATHETERIZATION Left 05/15/2015   Procedure: Left Heart Cath and Coronary Angiography;  Surgeon: Alwyn Pea, MD;  Location: [facility] INVASIVE CV LAB;  Service: Cardiovascular;  Laterality: Left;   CHOLECYSTECTOMY     COLONOSCOPY N/A 03/10/2015   Procedure: COLONOSCOPY;  Surgeon: Scot Jun, MD;  Location: [facility] ENDOSCOPY;  Service: Endoscopy;  Laterality: N/A;   IDDM  Vancomycin/ Gentamycin Preop   CORONARY ANGIOPLASTY     ESOPHAGOGASTRODUODENOSCOPY (EGD) WITH PROPOFOL N/A 05/22/2015   Procedure: ESOPHAGOGASTRODUODENOSCOPY (EGD) WITH PROPOFOL;  Surgeon: Scot Jun, MD;  Location: [facility] ENDOSCOPY;  Service: Endoscopy;  Laterality: N/A;   ESOPHAGOGASTRODUODENOSCOPY (EGD) WITH PROPOFOL N/A 07/05/2022   Procedure: ESOPHAGOGASTRODUODENOSCOPY (EGD) WITH PROPOFOL;  Surgeon: Jaynie Collins, DO;  Location: [facility] ENDOSCOPY;  Service: Endoscopy;  Laterality: N/A;   HERNIA REPAIR     REPLACEMENT TOTAL KNEE     RIGHT/LEFT HEART CATH AND CORONARY ANGIOGRAPHY N/A 01/19/2022   Procedure: RIGHT/LEFT HEART CATH AND CORONARY ANGIOGRAPHY;  Surgeon: Alwyn Pea, MD;  Location: [facility] INVASIVE CV LAB;  Service: Cardiovascular;  Laterality: N/A;    Allergies: Allergies as of 07/05/2023 - Review Complete 07/05/2023  Allergen Reaction Noted   Amoxicillin Hives 02/10/2015   Iodinated contrast media Hives 03/05/2015   Lipitor [atorvastatin]  03/05/2015  Losartan Other (See Comments) 04/03/2015   Plavix [clopidogrel] Hives 02/10/2015    Medications:  Current Outpatient Medications:    apixaban (ELIQUIS) 5 MG TABS tablet, Take 5 mg by mouth 2 (two) times daily., Disp:  , Rfl:    diphenhydrAMINE (BENADRYL) 50 MG tablet, Take one hour prior to CT, Disp: 1 tablet, Rfl: 0   doxycycline (VIBRAMYCIN) 100 MG capsule, Take 100 mg by mouth daily., Disp: , Rfl:    empagliflozin (JARDIANCE) 25 MG TABS tablet, Take 25 mg by mouth daily., Disp: , Rfl:    losartan (COZAAR) 25 MG tablet, Take 25 mg by mouth daily., Disp: , Rfl:    metoprolol succinate (TOPROL-XL) 25 MG 24 hr tablet, Take 1 tablet by mouth daily., Disp: , Rfl:   Social History: Social History   Tobacco Use   Smoking status: Never   Smokeless tobacco: Never  Vaping Use   Vaping status: Never Used  Substance Use Topics   Alcohol use: No    Comment: occasional   Drug use: No    Family Medical History: No family history on file.  Physical Examination: Vitals:   07/05/23 0905  BP: 118/78    General: Patient is in no apparent distress. Attention to examination is appropriate.  Neck:   Supple.  Full range of motion.  Respiratory: Patient is breathing without any difficulty.   NEUROLOGICAL:     Awake, alert, oriented to person, place, and time.  Speech is clear and fluent.   Cranial Nerves: Pupils equal round and reactive to light.  Facial tone is symmetric.  Facial sensation is symmetric. Shoulder shrug is symmetric. Tongue protrusion is midline.  There is no pronator drift.  Strength: Side Biceps Triceps Deltoid Interossei Grip Wrist Ext. Wrist Flex.  R 5 5 5 5 5 5 5   L 5 5 5 5 5 5 5    Side Iliopsoas Quads Hamstring PF DF EHL  R 5 5 5 5 5 5   L 5 5 5 5 5 5    Reflexes are 1+ and symmetric at the biceps, triceps, brachioradialis, patella and achilles.   Hoffman's is absent.   Bilateral upper and lower extremity sensation is intact to light touch.    No evidence of dysmetria noted.    Medical Decision Making  Imaging: MRI L spine 06/27/2023 reviewed, read pending.  Similar to prior, there is bilateral lateral recess stenosis at L4-5 with possible compression of the traversing  L5 nerve root on the right.   I have personally reviewed the images and agree with the above interpretation.  Assessment and Plan: Mr. Brigante is a pleasant 83 y.o. 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 Previous injections provided temporary relief. Physical therapy recommended as primary non-surgical treatment. Surgery considered if physical therapy fails. Minimally invasive surgery proposed to relieve nerve root pressure. Addressed concerns about surgery related to age, diabetes, and atrial fibrillation. Described procedure as small with minimal risks. Reassured about hospital's quality of care and equipment. - Contact Dr. Mariah Milling for physical therapy referral at [facility]. - Schedule follow-up in 6-8 weeks to assess progress.   I spent a total of 30 minutes in this patient's care today. This time was spent reviewing pertinent records including imaging studies, obtaining and confirming history, performing a directed evaluation, formulating and discussing my recommendations, and  documenting the visit within the medical record.      Thank you for involving me in the care of this patient.      Sharese Manrique K. Myer Haff MD, [facility]

## 2023-07-05 ENCOUNTER — Ambulatory Visit: Admitting: Neurosurgery

## 2023-07-05 ENCOUNTER — Encounter: Payer: Self-pay | Admitting: Neurosurgery

## 2023-07-05 VITALS — BP 118/78 | Ht 74.0 in | Wt 191.0 lb

## 2023-07-05 DIAGNOSIS — M5126 Other intervertebral disc displacement, lumbar region: Secondary | ICD-10-CM

## 2023-07-05 DIAGNOSIS — M5416 Radiculopathy, lumbar region: Secondary | ICD-10-CM

## 2023-08-25 ENCOUNTER — Encounter: Payer: Self-pay | Admitting: Neurosurgery

## 2023-08-25 ENCOUNTER — Ambulatory Visit: Admitting: Neurosurgery

## 2023-08-25 VITALS — BP 128/78 | Ht 74.0 in | Wt 195.0 lb

## 2023-08-25 DIAGNOSIS — M5416 Radiculopathy, lumbar region: Secondary | ICD-10-CM

## 2023-08-25 NOTE — Progress Notes (Signed)
 Referring Physician:  No referring provider defined for this encounter.  Primary Physician:  Jimmy Moulding, MD  History of Present Illness: 08/25/2023 Joseph Mann returns to see me.  His pain is a little bit better.  He is having pain approximate 5 out of 10.  He does still have some weakness.  07/05/2023 History of Present Illness The patient, with a history of diabetes and atrial fibrillation, presents with chronic right lower leg and outer thigh pain that has been ongoing for over a year. The pain is severe enough to limit the patient's mobility, causing difficulty in walking short distances and standing in one place. The patient reports that the pain is relieved when sitting in a recliner. The patient has received three back injections for the pain, the first of which provided relief for over a year, but the subsequent ones were less effective. The patient also reports episodes of weakness in both legs, lightheadedness, dizziness, and shortness of breath. The patient has not taken any medications for the leg pain, except for occasional extra-strength Tylenol  when the pain becomes severe.  Bowel/Bladder Dysfunction: none  Conservative measures:  Physical therapy: has not participated in PT Multimodal medical therapy including regular antiinflammatories: Prednisone , Gabapentin Injections: 06/24/2023 Right L5-S1 TESI 04/14/2023 Right L5-S1 TESI 02/17/2023 Right L5-S1 TESI  Past Surgery: R L4/5 surgery many years ago by Dr. Eliott Guess has no symptoms of cervical myelopathy.  The symptoms are causing a significant impact on the patient's life.   I have utilized the care everywhere function in epic to review the outside records available from external health systems.  Review of Systems:  A 10 point review of systems is negative, except for the pertinent positives and negatives detailed in the HPI.  Past Medical History: Past Medical History:  Diagnosis Date   AAA  (abdominal aortic aneurysm) (HCC)    Atrial fibrillation (HCC)    BPH (benign prostatic hyperplasia)    Coronary artery disease    Depression    Diabetes mellitus without complication (HCC)    Dysrhythmia    GERD (gastroesophageal reflux disease)    Headache    migraines   Hyperlipidemia    Hypertension    MI (myocardial infarction) [facility])     Past Surgical History: Past Surgical History:  Procedure Laterality Date   BACK SURGERY     CARDIAC CATHETERIZATION Left 05/15/2015   Procedure: Left Heart Cath and Coronary Angiography;  Surgeon: Antonette Batters, MD;  Location: [facility] INVASIVE CV LAB;  Service: Cardiovascular;  Laterality: Left;   CHOLECYSTECTOMY     COLONOSCOPY N/A 03/10/2015   Procedure: COLONOSCOPY;  Surgeon: Cassie Click, MD;  Location: [facility] ENDOSCOPY;  Service: Endoscopy;  Laterality: N/A;   IDDM  Vancomycin / Gentamycin Preop   CORONARY ANGIOPLASTY     ESOPHAGOGASTRODUODENOSCOPY (EGD) WITH PROPOFOL  N/A 05/22/2015   Procedure: ESOPHAGOGASTRODUODENOSCOPY (EGD) WITH PROPOFOL ;  Surgeon: Cassie Click, MD;  Location: [facility] ENDOSCOPY;  Service: Endoscopy;  Laterality: N/A;   ESOPHAGOGASTRODUODENOSCOPY (EGD) WITH PROPOFOL  N/A 07/05/2022   Procedure: ESOPHAGOGASTRODUODENOSCOPY (EGD) WITH PROPOFOL ;  Surgeon: Quintin Buckle, DO;  Location: [facility] ENDOSCOPY;  Service: Endoscopy;  Laterality: N/A;   HERNIA REPAIR     REPLACEMENT TOTAL KNEE     RIGHT/LEFT HEART CATH AND CORONARY ANGIOGRAPHY N/A 01/19/2022   Procedure: RIGHT/LEFT HEART CATH AND CORONARY ANGIOGRAPHY;  Surgeon: Antonette Batters, MD;  Location: [facility] INVASIVE CV LAB;  Service: Cardiovascular;  Laterality: N/A;    Allergies: Allergies as  of 08/25/2023 - Review Complete 08/25/2023  Allergen Reaction Noted   Amoxicillin Hives 02/10/2015   Iodinated contrast media Hives 03/05/2015   Lipitor [atorvastatin]  03/05/2015   Losartan  Other (See Comments) 04/03/2015   Plavix [clopidogrel] Hives 02/10/2015     Medications:  Current Outpatient Medications:    apixaban  (ELIQUIS ) 5 MG TABS tablet, Take 5 mg by mouth 2 (two) times daily., Disp: , Rfl:    diphenhydrAMINE  (BENADRYL ) 50 MG tablet, Take one hour prior to CT, Disp: 1 tablet, Rfl: 0   empagliflozin (JARDIANCE) 25 MG TABS tablet, Take 25 mg by mouth daily., Disp: , Rfl:    losartan  (COZAAR ) 25 MG tablet, Take 25 mg by mouth daily., Disp: , Rfl:    metoprolol  succinate (TOPROL -XL) 25 MG 24 hr tablet, Take 1 tablet by mouth daily., Disp: , Rfl:   Social History: Social History   Tobacco Use   Smoking status: Never   Smokeless tobacco: Never  Vaping Use   Vaping status: Never Used  Substance Use Topics   Alcohol use: No    Comment: occasional   Drug use: No    Family Medical History: No family history on file.  Physical Examination: Vitals:   08/25/23 1051  BP: 128/78    General: Patient is in no apparent distress. Attention to examination is appropriate.  Neck:   Supple.  Full range of motion.  Respiratory: Patient is breathing without any difficulty.   NEUROLOGICAL:     Awake, alert, oriented to person, place, and time.  Speech is clear and fluent.   Cranial Nerves: Pupils equal round and reactive to light.  Facial tone is symmetric.  Facial sensation is symmetric. Shoulder shrug is symmetric. Tongue protrusion is midline.  There is no pronator drift.  Strength: Side Biceps Triceps Deltoid Interossei Grip Wrist Ext. Wrist Flex.  R 5 5 5 5 5 5 5   L 5 5 5 5 5 5 5    Side Iliopsoas Quads Hamstring PF DF EHL  R 5 5 5 5 5 5   L 5 5 5 5 5 5    Reflexes are 1+ and symmetric at the biceps, triceps, brachioradialis, patella and achilles.   Hoffman's is absent.   Bilateral upper and lower extremity sensation is intact to light touch.    No evidence of dysmetria noted.    Medical Decision Making  Imaging: MRI L spine 06/27/2023 reviewed, read pending.  Similar to prior, there is bilateral lateral recess  stenosis at L4-5 with possible compression of the traversing L5 nerve root on the right.   I have personally reviewed the images and agree with the above interpretation.  Assessment and Plan: Joseph Mann is a pleasant 83 y.o. 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  He has improved somewhat.  I have suggested that he continue physical therapy for now.  I will see him back in a few weeks.  If he is not improved at that time, we will discuss repeat lumbar decompression.  We discussed that his relative weakness may or may not improve over time, but that surgery at this point would not be the determining factor.  I spent a total of 10 minutes in this patient's care today. This time was spent reviewing pertinent records including imaging studies, obtaining and confirming history, performing a directed evaluation, formulating and discussing my recommendations, and documenting the visit within the medical record.      Thank you for involving me in the care of this patient.  Roemello Speyer K. Mont Antis MD, [facility] Neurosurgery

## 2023-09-01 ENCOUNTER — Ambulatory Visit: Admitting: Neurosurgery

## 2023-09-20 ENCOUNTER — Ambulatory Visit: Payer: Federal, State, Local not specified - PPO | Admitting: Urology

## 2023-10-04 ENCOUNTER — Ambulatory Visit: Admitting: Neurosurgery

## 2023-10-04 ENCOUNTER — Encounter: Payer: Self-pay | Admitting: Neurosurgery

## 2023-10-04 VITALS — BP 122/76 | Ht 74.0 in | Wt 195.0 lb

## 2023-10-04 DIAGNOSIS — M5416 Radiculopathy, lumbar region: Secondary | ICD-10-CM | POA: Diagnosis not present

## 2023-10-04 DIAGNOSIS — M21371 Foot drop, right foot: Secondary | ICD-10-CM

## 2023-10-04 NOTE — Progress Notes (Signed)
 Referring Physician:  Lenon Layman ORN, MD [address] [facility] [place],  [place] [address]  Primary Physician:  Lenon Layman ORN, MD  History of Present Illness: 10/04/2023 Joseph Mann has continued pain.  He has continued weakness in his right lower extremity.   08/25/2023 Joseph Mann returns to see me.  His pain is a little bit better.  He is having pain approximate 5 out of 10.  He does still have some weakness.  07/05/2023 History of Present Illness Joseph Mann, with a history of diabetes and atrial fibrillation, presents with chronic right lower leg and outer thigh pain that has been ongoing for over a year. Joseph pain is severe enough to limit Joseph Mann's mobility, causing difficulty in walking short distances and standing in one place. Joseph Mann reports that Joseph pain is relieved when sitting in a recliner. Joseph Mann has received three back injections for Joseph pain, Joseph first of which provided relief for over a year, but Joseph subsequent ones were less effective. Joseph Mann also reports episodes of weakness in both legs, lightheadedness, dizziness, and shortness of breath. Joseph Mann has not taken any medications for Joseph leg pain, except for occasional extra-strength Tylenol  when Joseph pain becomes severe.  Bowel/Bladder Dysfunction: none  Conservative measures:  Physical therapy: has not participated in PT Multimodal medical therapy including regular antiinflammatories: Prednisone , Gabapentin Injections: 06/24/2023 Right L5-S1 TESI 04/14/2023 Right L5-S1 TESI 02/17/2023 Right L5-S1 TESI  Past Surgery: R L4/5 surgery many years ago by Dr. Kathi Emil FORBES Joseph Mann has no symptoms of cervical myelopathy.  Joseph symptoms are causing a significant impact on Joseph Mann's life.   I have utilized Joseph care everywhere function in epic to review Joseph outside records available from external health systems.  Review of Systems:  A 10 point review of systems is  negative, except for Joseph pertinent positives and negatives detailed in Joseph HPI.  Past Medical History: Past Medical History:  Diagnosis Date   AAA (abdominal aortic aneurysm) (HCC)    Atrial fibrillation (HCC)    BPH (benign prostatic hyperplasia)    Coronary artery disease    Depression    Diabetes mellitus without complication (HCC)    Dysrhythmia    GERD (gastroesophageal reflux disease)    Headache    migraines   Hyperlipidemia    Hypertension    MI (myocardial infarction) [facility])     Past Surgical History: Past Surgical History:  Procedure Laterality Date   BACK SURGERY     CARDIAC CATHETERIZATION Left 05/15/2015   Procedure: Left Heart Cath and Coronary Angiography;  Surgeon: Cara JONETTA Lovelace, MD;  Location: [facility] INVASIVE CV LAB;  Service: Cardiovascular;  Laterality: Left;   CHOLECYSTECTOMY     COLONOSCOPY N/A 03/10/2015   Procedure: COLONOSCOPY;  Surgeon: Lamar ONEIDA Holmes, MD;  Location: [facility] ENDOSCOPY;  Service: Endoscopy;  Laterality: N/A;   IDDM  Vancomycin / Gentamycin Preop   CORONARY ANGIOPLASTY     ESOPHAGOGASTRODUODENOSCOPY (EGD) WITH PROPOFOL  N/A 05/22/2015   Procedure: ESOPHAGOGASTRODUODENOSCOPY (EGD) WITH PROPOFOL ;  Surgeon: Lamar ONEIDA Holmes, MD;  Location: [facility] ENDOSCOPY;  Service: Endoscopy;  Laterality: N/A;   ESOPHAGOGASTRODUODENOSCOPY (EGD) WITH PROPOFOL  N/A 07/05/2022   Procedure: ESOPHAGOGASTRODUODENOSCOPY (EGD) WITH PROPOFOL ;  Surgeon: Onita Elspeth Sharper, DO;  Location: [facility] ENDOSCOPY;  Service: Endoscopy;  Laterality: N/A;   HERNIA REPAIR     REPLACEMENT TOTAL KNEE     RIGHT/LEFT HEART CATH AND CORONARY ANGIOGRAPHY N/A 01/19/2022   Procedure: RIGHT/LEFT HEART  CATH AND CORONARY ANGIOGRAPHY;  Surgeon: Florencio Cara BIRCH, MD;  Location: [facility] INVASIVE CV LAB;  Service: Cardiovascular;  Laterality: N/A;    Allergies: Allergies as of 10/04/2023 - Review Complete 10/04/2023  Allergen Reaction Noted   Amoxicillin Hives 02/10/2015   Iodinated contrast  media Hives 03/05/2015   Lipitor [atorvastatin]  03/05/2015   Losartan  Other (See Comments) 04/03/2015   Plavix [clopidogrel] Hives 02/10/2015    Medications:  Current Outpatient Medications:    apixaban  (ELIQUIS ) 5 MG TABS tablet, Take 5 mg by mouth 2 (two) times daily., Disp: , Rfl:    clotrimazole (LOTRIMIN) 1 % external solution, Apply topically 2 (two) times daily., Disp: , Rfl:    diphenhydrAMINE  (BENADRYL ) 50 MG tablet, Take one hour prior to CT, Disp: 1 tablet, Rfl: 0   empagliflozin (JARDIANCE) 25 MG TABS tablet, Take 25 mg by mouth daily., Disp: , Rfl:    losartan  (COZAAR ) 25 MG tablet, Take 25 mg by mouth daily., Disp: , Rfl:    metoprolol  succinate (TOPROL -XL) 25 MG 24 hr tablet, Take 1 tablet by mouth daily., Disp: , Rfl:    pravastatin  (PRAVACHOL ) 20 MG tablet, Take 20 mg by mouth at bedtime., Disp: , Rfl:    pregabalin (LYRICA) 25 MG capsule, Take 25 mg by mouth., Disp: , Rfl:   Social History: Social History   Tobacco Use   Smoking status: Never   Smokeless tobacco: Never  Vaping Use   Vaping status: Never Used  Substance Use Topics   Alcohol use: No    Comment: occasional   Drug use: No    Family Medical History: History reviewed. No pertinent family history.  Physical Examination: Vitals:   10/04/23 1344  BP: 122/76     General: Mann is in no apparent distress. Attention to examination is appropriate.  Neck:   Supple.  Full range of motion.  Respiratory: Mann is breathing without any difficulty.   NEUROLOGICAL:     Awake, alert, oriented to person, place, and time.  Speech is clear and fluent.   Cranial Nerves: Pupils equal round and reactive to light.  Facial tone is symmetric.  Facial sensation is symmetric. Shoulder shrug is symmetric. Tongue protrusion is midline.  There is no pronator drift.  Strength: Side Biceps Triceps Deltoid Interossei Grip Wrist Ext. Wrist Flex.  R 5 5 5 5 5 5 5   L 5 5 5 5 5 5 5    Side Iliopsoas Quads  Hamstring PF DF EHL  R 5 5 5 5 4 4   L 5 5 5 5 5 5    Reflexes are 1+ and symmetric at Joseph biceps, triceps, brachioradialis, patella and achilles.   Hoffman's is absent.   Bilateral upper and lower extremity sensation is intact to light touch.    No evidence of dysmetria noted.  Discomfort around Joseph right head of Joseph fibula.  He also has preserved eversion on Joseph right side.   Medical Decision Making  Imaging: MRI L spine 06/27/2023  L4-L5: Disc bulge. Moderate bilateral facet arthropathy. Severe left and moderate right neuroforaminal stenosis. Mild spinal canal stenosis.   L5-S1: Disc bulge. Moderate bilateral facet arthropathy. Severe bilateral neuroforaminal stenosis. No spinal canal stenosis.   IMPRESSION: 1. Mild canal stenoses at several levels in Joseph lumbar spine secondary to disc bulging and facet arthropathy. 2. Moderate and severe foraminal stenoses throughout Joseph lumbar spine secondary to disc bulging and facet arthropathy as detailed above. 3. 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     Electronically Signed   By: Clem Savory M.D.   On: 07/20/2023 12:21   I have personally reviewed Joseph images and agree with Joseph above interpretation.  Assessment and Plan: Joseph Mann is a pleasant 83 y.o. 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   I would like to get a nerve conduction study to help elucidate his condition.   I spent a total of 10 minutes in this Mann's care today. This time was spent reviewing pertinent records including imaging studies, obtaining and confirming history, performing a directed evaluation, formulating and discussing my recommendations, and documenting Joseph visit within Joseph medical record.      Thank you for involving me in Joseph care of this Mann.      Joaquim Tolen K. Clois MD, [facility] Neurosurgery

## 2023-10-05 ENCOUNTER — Encounter: Payer: Self-pay | Admitting: Neurology

## 2023-10-05 ENCOUNTER — Other Ambulatory Visit: Payer: Self-pay

## 2023-10-05 DIAGNOSIS — R202 Paresthesia of skin: Secondary | ICD-10-CM

## 2023-11-10 ENCOUNTER — Ambulatory Visit: Admitting: Neurology

## 2023-11-10 DIAGNOSIS — M5417 Radiculopathy, lumbosacral region: Secondary | ICD-10-CM

## 2023-11-10 DIAGNOSIS — R202 Paresthesia of skin: Secondary | ICD-10-CM

## 2023-11-10 DIAGNOSIS — G5701 Lesion of sciatic nerve, right lower limb: Secondary | ICD-10-CM

## 2023-11-10 NOTE — Procedures (Signed)
 [facility] Neurology  [address] [place], [address]  [place], [place] [address] Tel: [phone] Fax: [phone] Test Date:  11/10/2023  Patient: Joseph Mann DOB: [birth year] Physician: Tonita Blanch, DO  Sex: Male Height: 6' 2 Ref Phys: Reeves Daisy, MD  ID#: [id number]   Technician:    History: This is a 83 year old man referred for evaluation of right foot weakness.  NCV & EMG Findings: Extensive electrodiagnostic testing of the right lower extremity and additional studies of the left shows:  Right sural superficial peroneal sensory response is absent.  Left sural superficial peroneal and bilateral sural sensory responses are within normal limits. Right peroneal motor response at the extensor digitorum brevis is barely present and shows reduced amplitude at the tibialis anterior.  Left peroneal motor response at the extensor digitorum brevis is absent, normal at the tibialis anterior.  Bilateral tibial motor responses show reduced amplitudes. Tibial H reflex studies are absent bilaterally. Chronic motor axonal loss changes are seen affecting the right anterior tibialis and fibularis longus with fibrillation potentials.  Chronic motor axonal loss changes are also seen in the right S1 myotome.  Proximal and deep muscles were not tested as the patient is on anticoagulation therapy.  Impression: Active on chronic right peroneal mononeuropathy, nonlocalizable, severe.  Consider nerve ultrasound further evaluate the peroneal nerve.  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   ___________________________ Tonita Blanch, DO    Nerve Conduction Studies   Stim Site NR Peak (ms) Norm Peak (ms) O-P Amp (V) Norm O-P Amp  Left Sup Peroneal Anti Sensory (Ant Lat Mall)  32 C  12 cm    3.1 <4.6 4.2 >3  Right Sup Peroneal Anti Sensory (Ant Lat Mall)  32 C  12 cm *NR  <4.6  >3  Left Sural Anti Sensory (Lat Mall)  32 C  Calf    3.2 <4.6 5.1 >3  Right Sural Anti  Sensory (Lat Mall)  32 C  Calf    3.3 <4.6 5.2 >3     Stim Site NR Onset (ms) Norm Onset (ms) O-P Amp (mV) Norm O-P Amp Site1 Site2 Delta-0 (ms) Dist (cm) Vel (m/s) Norm Vel (m/s)  Left Peroneal Motor (Ext Dig Brev)  32 C  Ankle *NR  <6.0  >2.5 B Fib Ankle  0.0  >40  B Fib *NR     Poplt B Fib  0.0  >40  Poplt *NR            Right Peroneal Motor (Ext Dig Brev)  32 C  Ankle    3.8 <6.0 *0.5 >2.5 B Fib Ankle 11.0 44.0 40 >40  B Fib    14.8  0.3  Poplt B Fib 2.3 10.0 43 >40  Poplt    17.1  0.3         Left Peroneal TA Motor (Tib Ant)  32 C  Fib Head    3.1 <4.5 3.7 >3 Poplit Fib Head 2.3 10.0 43 >40  Poplit    5.4 <5.7 3.6         Right Peroneal TA Motor (Tib Ant)  32 C  Fib Head    3.8 <4.5 *1.4 >3 Poplit Fib Head 1.7 10.0 59 >40  Poplit    5.5 <5.7 1.3         Left Tibial Motor (Abd Hall Brev)  32 C  Ankle    4.1 <6.0 *1.2 >4 Knee Ankle 11.6 46.0 40 >40  Knee    15.7  0.8         Right Tibial Motor (Abd Hall Brev)  32 C  Ankle    5.9 <6.0 *1.4 >4 Knee Ankle 11.2 45.0 40 >40  Knee    17.1  1.3          Electromyography   Side Muscle Ins.Act Fibs Fasc Recrt Amp Dur Poly Activation Comment  Right AntTibialis Nml *1+ Nml *3- *1+ *1+ *1+ Nml N/A  Right Gastroc Nml Nml Nml *1- *1+ *1+ *1+ Nml N/A  Right RectFemoris Nml Nml Nml Nml Nml Nml Nml Nml N/A  Right BicepsFemS Nml Nml Nml *1- *1+ *1+ *1+ Nml N/A  Right GluteusMed Nml Nml Nml Nml Nml Nml Nml Nml N/A  Right Fibularis Long Nml Nml Nml *3- *1+ *1+ *1+ Nml N/A      Waveforms:

## 2023-11-28 NOTE — Progress Notes (Signed)
 Referring Physician:  Lenon Mann ORN, MD [address] [facility] [place],  [place] [address]  Primary Physician:  Joseph Mann ORN, MD  History of Present Illness: 12/05/2023 [name] is here today with a chief complaint of right-sided foot drop.  He has a history of chronic right lower extremity pain as well as outer thigh pain.  He has been having worsening foot drop and weakness in his right lower extremity.  Was seen by my partner Dr. Katrina who evaluated him and was concerned that he may have a peripheral issue rather than a clear spinal issue so he was sent to neurology.  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  He is here today to follow-up.  Continues to have weakness in the right lower extremity.  States that this weakness is the thing that bothers him the most.  He also gets tingling/weakness sensation in his right lower extremity that is quite bothersome.  He does get intermittent pain from his back down the posterior aspect of his leg but states that this is intermittent and is not his major concern.   Bowel/Bladder Dysfunction: none   Conservative measures:  Physical therapy: has not participated in PT Multimodal medical therapy including regular antiinflammatories: Prednisone , Gabapentin Injections: 06/24/2023 Right L5-S1 TESI 04/14/2023 Right L5-S1 TESI 02/17/2023 Right L5-S1 TESI   Past Surgery: R L4/5 surgery many years ago by Dr. Kathi Mann Joseph Mann has no symptoms of cervical myelopathy.   The symptoms are causing a significant impact on the patient's life.    I have utilized the care everywhere function in epic to review the outside records available from external health systems.    Review of Systems:  A 10 point review of systems is negative, except for the pertinent positives and negatives detailed in the HPI.  Past Medical History: Past Medical History:   Diagnosis Date   AAA (abdominal aortic aneurysm) (HCC)    Atrial fibrillation (HCC)    BPH (benign prostatic hyperplasia)    Coronary artery disease    Depression    Diabetes mellitus without complication (HCC)    Dysrhythmia    GERD (gastroesophageal reflux disease)    Headache    migraines   Hyperlipidemia    Hypertension    MI (myocardial infarction) [facility])     Past Surgical History: Past Surgical History:  Procedure Laterality Date   BACK SURGERY     CARDIAC CATHETERIZATION Left 05/15/2015   Procedure: Left Heart Cath and Coronary Angiography;  Surgeon: Joseph JONETTA Lovelace, MD;  Location: [facility] INVASIVE CV LAB;  Service: Cardiovascular;  Laterality: Left;   CHOLECYSTECTOMY     COLONOSCOPY N/A 03/10/2015   Procedure: COLONOSCOPY;  Surgeon: Joseph ONEIDA Holmes, MD;  Location: [facility] ENDOSCOPY;  Service: Endoscopy;  Laterality: N/A;   IDDM  Vancomycin / Gentamycin Preop   CORONARY ANGIOPLASTY     ESOPHAGOGASTRODUODENOSCOPY (EGD) WITH PROPOFOL  N/A 05/22/2015   Procedure: ESOPHAGOGASTRODUODENOSCOPY (EGD) WITH PROPOFOL ;  Surgeon: Joseph ONEIDA Holmes, MD;  Location: [facility] ENDOSCOPY;  Service: Endoscopy;  Laterality: N/A;   ESOPHAGOGASTRODUODENOSCOPY (EGD) WITH PROPOFOL  N/A 07/05/2022   Procedure: ESOPHAGOGASTRODUODENOSCOPY (EGD) WITH PROPOFOL ;  Surgeon: Joseph Elspeth Sharper, DO;  Location: [facility] ENDOSCOPY;  Service: Endoscopy;  Laterality: N/A;   HERNIA REPAIR     REPLACEMENT TOTAL KNEE     RIGHT/LEFT HEART CATH AND CORONARY ANGIOGRAPHY N/A 01/19/2022   Procedure: RIGHT/LEFT HEART CATH AND CORONARY ANGIOGRAPHY;  Surgeon:  Mann, Joseph D, MD;  Location: [facility] INVASIVE CV LAB;  Service: Cardiovascular;  Laterality: N/A;    Allergies: Allergies as of 12/05/2023 - Review Complete 12/05/2023  Allergen Reaction Noted   Amoxicillin Hives 02/10/2015   Iodinated contrast media Hives 03/05/2015   Lipitor [atorvastatin]  03/05/2015   Losartan  Other (See Comments) 04/03/2015   Plavix [clopidogrel]  Hives 02/10/2015    Medications:  Current Outpatient Medications:    apixaban  (ELIQUIS ) 5 MG TABS tablet, Take 5 mg by mouth 2 (two) times daily., Disp: , Rfl:    clotrimazole (LOTRIMIN) 1 % external solution, Apply topically 2 (two) times daily., Disp: , Rfl:    diphenhydrAMINE  (BENADRYL ) 50 MG tablet, Take one hour prior to CT, Disp: 1 tablet, Rfl: 0   empagliflozin (JARDIANCE) 25 MG TABS tablet, Take 25 mg by mouth daily., Disp: , Rfl:    losartan  (COZAAR ) 25 MG tablet, Take 25 mg by mouth daily., Disp: , Rfl:    metoprolol  succinate (TOPROL -XL) 25 MG 24 hr tablet, Take 1 tablet by mouth daily., Disp: , Rfl:    pantoprazole (PROTONIX) 40 MG tablet, Take 40 mg by mouth 2 (two) times daily., Disp: , Rfl:    pravastatin  (PRAVACHOL ) 20 MG tablet, Take 20 mg by mouth at bedtime., Disp: , Rfl:    pregabalin (LYRICA) 25 MG capsule, Take 25 mg by mouth 2 (two) times daily., Disp: , Rfl:   Social History: Social History   Tobacco Use   Smoking status: Never   Smokeless tobacco: Never  Vaping Use   Vaping status: Never Used  Substance Use Topics   Alcohol use: No    Comment: occasional   Drug use: No    Family Medical History: No family history on file.  Physical Examination: Vitals:   12/05/23 1013  BP: 104/80    General: Patient is in no apparent distress. Attention to examination is appropriate.  Neck:   Supple.  Full range of motion.  Respiratory: Patient is breathing without any difficulty.   NEUROLOGICAL:     Awake, alert, oriented to person, place, and time.  Speech is clear and fluent.   Cranial Nerves: Pupils equal round and reactive to light.  Facial tone is symmetric. Shoulder shrug is symmetric. Tongue protrusion is midline.  There is no pronator drift.  Motor Exam:  He does have significant foot drop on the right lower extremity with toe extension weakness.  He has mild eversion weakness.  His plantarflexion continues to be strong.  He has a severe Tinel's  sign at the right peroneal nerve as across at the fibular neck.  Has noticeable foot drop and steppage gait while walking.  No evidence of hip drop.  Medical Decision Making  Electrodiagnostics:  [facility]  [address] [place], [address]  [place], [place] [address] Tel: [phone] Fax: [phone] Test Date:  11/10/2023   Patient: Joseph Mann DOB: 03/25/1941 Physician: Tonita Blanch, DO  Sex: Male Height: 6' 2 Ref Phys: Reeves Daisy, MD  ID#: [id number]     Technician:      History: This is a 83 year old man referred for evaluation of right foot weakness.   NCV & EMG Findings: Extensive electrodiagnostic testing of the right lower extremity and additional studies of the left shows:  Right sural superficial peroneal sensory response is absent.  Left sural superficial peroneal and bilateral sural sensory responses are within normal limits. Right peroneal motor response at the extensor digitorum brevis is barely present and shows reduced  amplitude at the tibialis anterior.  Left peroneal motor response at the extensor digitorum brevis is absent, normal at the tibialis anterior.  Bilateral tibial motor responses show reduced amplitudes. Tibial H reflex studies are absent bilaterally. Chronic motor axonal loss changes are seen affecting the right anterior tibialis and fibularis longus with fibrillation potentials.  Chronic motor axonal loss changes are also seen in the right S1 myotome.  Proximal and deep muscles were not tested as the patient is on anticoagulation therapy.   Impression: Active on chronic right peroneal mononeuropathy, nonlocalizable, severe.  Consider nerve ultrasound further evaluate the peroneal nerve.  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     ___________________________ Tonita Blanch, DO    I have personally reviewed the images and electrodiagnostics and agree with the above interpretation.  Assessment and Plan: Mr.  Joseph Mann is a pleasant 83 y.o. male with right-sided foot drop.  He has a history of right leg pain back pain and worsening right-sided foot drop.  This continues to be bothersome to him.  He has a steppage gait.  On physical examination he has significant foot drop with approximately 2 out of 5 strength.  Weakness in his toe extension.  Plan to get a MRI of his right knee to evaluate for possible peroneal intraneural cyst.  Plan for a peroneal decompression, possible peroneal superior tibiofibular joint branch avulsion.  Once MRI scheduled we can plan for decompression and possible joint branch neurectomy.  The symptoms are causing a significant impact on the patient's life. I have utilized the care everywhere function in epic to review the outside records available from external health systems, and have personally reviewed relevant imaging and electrodiagnostic workup.   Thank you for involving me in the care of this patient.    Penne MICAEL Sharps MD/MSCR Neurosurgery - Peripheral Nerve Surgery

## 2023-12-05 ENCOUNTER — Ambulatory Visit: Admitting: Neurosurgery

## 2023-12-05 VITALS — BP 104/80 | Ht 74.0 in | Wt 193.1 lb

## 2023-12-05 DIAGNOSIS — G5731 Lesion of lateral popliteal nerve, right lower limb: Secondary | ICD-10-CM | POA: Diagnosis not present

## 2023-12-05 DIAGNOSIS — M21371 Foot drop, right foot: Secondary | ICD-10-CM | POA: Diagnosis not present

## 2023-12-09 ENCOUNTER — Ambulatory Visit
Admission: RE | Admit: 2023-12-09 | Discharge: 2023-12-09 | Disposition: A | Discharge status: Home / Self Care | Source: Ambulatory Visit | Attending: Neurosurgery | Admitting: Neurosurgery

## 2023-12-09 DIAGNOSIS — G5731 Lesion of lateral popliteal nerve, right lower limb: Secondary | ICD-10-CM | POA: Insufficient documentation

## 2023-12-09 DIAGNOSIS — M21371 Foot drop, right foot: Secondary | ICD-10-CM | POA: Diagnosis present

## 2023-12-10 ENCOUNTER — Ambulatory Visit: Payer: Self-pay | Admitting: Neurosurgery

## 2023-12-10 DIAGNOSIS — M21371 Foot drop, right foot: Secondary | ICD-10-CM

## 2023-12-10 DIAGNOSIS — G5731 Lesion of lateral popliteal nerve, right lower limb: Secondary | ICD-10-CM

## 2023-12-13 ENCOUNTER — Telehealth: Payer: Self-pay

## 2023-12-13 ENCOUNTER — Other Ambulatory Visit: Payer: Self-pay

## 2023-12-13 DIAGNOSIS — M21371 Foot drop, right foot: Secondary | ICD-10-CM

## 2023-12-13 DIAGNOSIS — G5731 Lesion of lateral popliteal nerve, right lower limb: Secondary | ICD-10-CM

## 2023-12-13 DIAGNOSIS — Z01818 Encounter for other preprocedural examination: Secondary | ICD-10-CM

## 2023-12-13 NOTE — Telephone Encounter (Signed)
 Planned surgery: right peroneal nerve decompression   Surgery date: 01/05/24 at [facility]: [address] [place], [place] [address]) - you will find out your arrival time the business day before your surgery.   Pre-op appointment at [facility] Pre-admit Testing: you will receive a call with a date/time for this appointment. If you are scheduled for an in person appointment, Pre-admit Testing is located on the first floor of the [facility], [address], [address]. During this appointment, they will advise you which medications you can take the morning of surgery, and which medications you will need to hold for surgery. Labs (such as blood work, EKG) may be done at your pre-op appointment. You are not required to fast for these labs. Should you need to change your pre-op appointment, please call Pre-admit testing at [phone].     Blood thinners:    Eliquis :   stop Eliquis  3 days prior, resume 7 days after     Diabetes/heart failure/kidney disease/weight loss medications that require an extended hold: Per anesthesia guidelines (due to the increased risk of aspiration caused by delayed gastric emptying):  Empagliflozin (Jardiance): hold for 3 days prior to surgery, can resume after surgery Semaglutide (Rybelsus) oral: hold for 1 day prior to surgery, can resume after surgery     Surgical clearance: we will send a clearance form to Dr Florencio. They may wish to see you in their office prior to signing the clearance form. If so, they may call you to schedule an appointment.      How to contact us :  If you have any questions/concerns before or after surgery, you can reach us  at [phone], or you can send a mychart message. We can be reached by phone or mychart 8am-4pm, Monday-Friday.  *Please note: Calls after 4pm are forwarded to a third party answering service. Mychart messages are not routinely monitored during  evenings, weekends, and holidays. Please call our office to contact the answering service for urgent concerns during non-business hours.    If you have FMLA/disability paperwork, please drop it off or fax it to [phone]   Appointments/FMLA & disability paperwork: Reche [employer]itta Registered Nurse/Surgery scheduler: Adria Costley, RN Certified Medical Assistants: Don, CMA, Elenor, CMA, & Damien, CMA Physician Assistants: Lyle Decamp, PA-C, Edsel Goods, PA-C & Glade Boys, PA-C Surgeons: Penne Sharps, MD & Reeves Daisy, MD   [facility] PREADMIT TESTING VISIT and SURGERY INFORMATION SHEET   Now that surgery has been scheduled you can anticipate several phone calls from [facility] services. A pharmacy technician will call you to verify your current list of medications taken at home.               The Pre-Service Center will call to verify your insurance information and to give you billing estimates and information.             The Preadmit Testing Office will be calling to schedule a visit to obtain information for the anesthesia team and provide instructions on preparation for surgery.  What can you expect for the Preadmit Testing Visit: Appointments may be scheduled in-person or by telephone.  If a telephone visit is scheduled, you may be asked to come into the office to have lab tests or other studies performed.   This visit will not be completed any greater than 14 days prior to your surgery.  If your surgery has been scheduled for a future date, please do not  be alarmed if we have not contacted you to schedule an appointment more than a month prior to the surgery date.    Please be prepared to provide the following information during this appointment:            -Personal medical history                                               -Medication and allergy list            -Any history of problems with anesthesia              -Recent lab work or  diagnostic studies            -Please notify us  of any needs we should be aware of to provide the best care possible           -You will be provided with instructions on how to prepare for your surgery.    On The Day of Surgery:  You must have a driver to take you home after surgery, you will be asked not to drive for 24 hours following surgery.  Taxi, Gisele and non-medical transport will not be acceptable means of transportation unless you have a responsible individual who will be traveling with you.  Visitors in the surgical area:   2 people will be able to visit you in your room once your preparation for surgery has been completed. During surgery, your visitors will be asked to wait in the Surgery Waiting Area.  It is not a requirement for them to stay, if they prefer to leave and come back.  Your visitor(s) will be given an update once the surgery has been completed.  No visitors are allowed in the initial recovery room to respect patient privacy and safety.  Once you are more awake and transfer to the secondary recovery area, or are transferred to an inpatient room, visitors will again be able to see you.  To respect and protect your privacy: We will ask on the day of surgery who your driver will be and what the contact number for that individual will be. We will ask if it is okay to share information with this individual, or if there is an alternative individual that we, or the surgeon, should contact to provide updates and information. If family or friends come to the surgical information desk requesting information about you, who you have not listed with us , no information will be given.   It may be helpful to designate someone as the main contact who will be responsible for updating your other friends and family.    PREADMIT TESTING OFFICE: [phone] SAME DAY SURGERY: [phone] We look forward to caring for you before and throughout the process of your surgery.

## 2023-12-13 NOTE — Telephone Encounter (Signed)
 Sent mychart message with date options. Case posted by Dr Claudene and is in depot.

## 2023-12-27 NOTE — Progress Notes (Addendum)
 HPI: Joseph Mann is seen today in consultation at the request of Joseph Macario Haddock, NP for evaluation of diabetes.  He is a 83 y.o. male who was diagnosed with diabetes more than 10 years. He is accompanied by his wife today.   He is currently on Jardiance 25 units daily. Rybelsus and MTF caused GI issues.  He tests his fasting sugars most days. He brought his log book and I reviewed it.  He  mostly in the mid 100s, occasionally higher.  Review of his diet shows he avoids concentrated carbs. He doesn't exercise due to DOE.  He denies numbness/tingling /burning in his feet. He is concerned about his diabetes.   ROS:  Standard 10 system review of systems negative except for fatigue, short of breath, diarrhea, urinating frequently, and easy bruising.   Medical History: Past Medical History:  Diagnosis Date  . BPH (benign prostatic hypertrophy)   . Diabetes mellitus type 2, uncomplicated (CMS/HHS-HCC)   . Hx of adenomatous colonic polyps 01/30/2015  . Hyperglycemia   . Hyperlipidemia   . Hypertension   . Migraine    HX    Surgical History: Past Surgical History:  Procedure Laterality Date  . COLONOSCOPY  07/25/2006   Adenomatous Polyps  . COLONOSCOPY  11/24/2009   PH Adenomatous Polyps: CBF 11/2014; TKR 06/2014 therefore will wait 6 months; CBF 12/2014; Recall Ltr mailed 11/08/2014 (dw)  . Left total knee arthroplasty using computer-assisted navigation Left 06/19/2014  . COLONOSCOPY  03/10/2015   Adenomatous Polyps: CBF 02/2020  . EGD  05/22/2015   No repeat per RTE  . OTHER SURGERY  01/19/2022   Heart CATH  . EGD @ [facility]  07/05/2022   Hyperplastic gastric polyp/No repeat/SMR  . CHOLECYSTECTOMY    . CHOLECYSTECTOMY    . ELBOW SURGERY FOR RADIAL NERVE ENTRAPMENT Left   . HERNIA REPAIR Bilateral    bilateral   . LUMBAR SURGERY     2009 DR CALIFF  . PCI AND STENT PLACEMENT      Social History:  reports that he has never smoked. He has never used smokeless tobacco. He reports  that he does not drink alcohol and does not use drugs.  He is married.  Family History: family history includes Gallbladder disease in an other family member; High blood pressure (Hypertension) in his mother; Myocardial Infarction (Heart attack) in his mother; Stroke in his mother.  Medications: Current Outpatient Medications  Medication Sig Dispense Refill  . clotrimazole (LOTRIMIN) 1 % topical solution Apply topically 2 (two) times daily 15 mL 0  . ELIQUIS  5 mg tablet TAKE 1 TABLET EVERY 12     HOURS 180 tablet 3  . empagliflozin (JARDIANCE) 25 mg tablet TAKE 1 TABLET BY MOUTH EVERY DAY 30 tablet 11  . FUROsemide (LASIX) 20 MG tablet take 1 tablet by mouth every day 90 tablet 0  . metoprolol  SUCCinate (TOPROL -XL) 25 MG XL tablet Take 1 tablet (25 mg total) by mouth once daily 90 tablet 3  . metroNIDAZOLE (METROCREAM) 0.75 % cream APPLY TO FACE DAILY AS DIRECTED    . pantoprazole (PROTONIX) 40 MG DR tablet TAKE 1 TABLET BY MOUTH 2 TIMES DAILY BEFORE MEALS. 180 tablet 3  . pravastatin  (PRAVACHOL ) 20 MG tablet TAKE 1 TABLET BY MOUTH EVERY DAY AT NIGHT 90 tablet 3  . pregabalin (LYRICA) 25 MG capsule Take 1 capsule (25 mg total) by mouth 2 (two) times daily 60 capsule 2  . psyllium (METAMUCIL) packet Take 1 packet by  mouth    . albuterol 90 mcg/actuation inhaler Inhale 2 inhalations into the lungs every 4 (four) hours as needed for Wheezing (Patient not taking: Reported on 12/27/2023) 1 each 2  . blood glucose diagnostic test strip Test blood sugar once a day (Contour next) 100 each 4  . blood glucose meter kit Use to test blood sugar.  Countour next 1 each 0  . tirzepatide (MOUNJARO) 2.5 mg/0.5 mL pen injector Inject 0.5 mLs (2.5 mg total) subcutaneously once a week 2 mL 6   No current facility-administered medications for this visit.    Allergies: Allergies  Allergen Reactions  . Clopidogrel Swelling, Rash and Hives  . Altoprev [Lovastatin] Other (See Comments)    CONSTIPATION   .  Amoxicillin Hives  . Iodinated Contrast Media Headache  . Lipitor [Atorvastatin] Muscle Pain  . Losartan  Dizziness    And weak  . Metrizamide Other (See Comments)    Physical Exam: Vitals:   12/27/23 1307  BP: 112/82  Pulse: 89  SpO2: 99%  Weight: 88.9 kg (196 lb)  Height: 188 cm (6' 2)   Body mass index is 25.16 kg/m. GENERAL: Pleasant, well-appearing male in no distress.   Physical exam otherwise deferred due to coronavirus precautions.  Labs: 12/29/2021: A1c = 6.9.  04/30/2022: A1c = 7.6. 08/31/2022: A1c = 7.4. 01/03/2023: A1c = 7.0.  MA = 31.8.  09/02/2023: A1c = 8.6.  K/Cr/Ca = 4.3/1.0/9.1.  MA = 30.9.  Chol = 142/74/44.4/83. LFTs nl. 11/21/2023: K/Cr/Ca = 4.4/1.1/9.1, eGFR = 67.  LFTs nl.  12/27/2023: A1c = 8.3.  Assessment/Plan: 1.  Diabetes. His A1c today is 8.3.  Based on review of his logbook.  We discussed option like Ozempic and Mounjaro.  We dicussed warnings and precautions.  I will start him on Mounajro 2.5 mg weekly (his wife is on 5 mg weekly).  I sent an rx.  I told him if he gets bad GI side effects from it, to let me know and I will send in Januvia or Tradjeta (depending on insurance preference). Rybelsus and MTF caused GI issues. I encouraged lifestyle modifications.   2. HTN/MA associated with diabetes. His BP is good today. His MA in 5/25 was elevated. He is on metoprolol  25 mg daily.  Consider adding an ace/arb  3. HDL associated with diabetes. His LDL in 5/25 was 83 on pravastatin  20 mg qpm daily.  4. Lyrica therapy.  He has had issues with LS spine and nerve compression in is right leg.  He is on Lyrica.  He says he has never been diagnosed with neuropathy.  5.  Retinopathy.   We got a copy from the eye doctor [facility]) on 05/18/2022. He had BDR but no tx was necessary.   He had a retinal photo that was negative on 09/15/2023.      6.  Prophylaxis.  I will plan a foot exam next visit.   He had a retinal photo in 6/25 as above.   7.  He will  return to clinic in 3-4 months   01/23/24:  Pt messaged.  Has taken 3 mounjaro shots and felt poorly after each.  Will switch to Tradjenta  This note is partially prepared by Earla Daria Messier, Scribe, in the presence of and acting as the scribe of Dr. Debby Breaker , MD.   [facility], MD

## 2023-12-29 ENCOUNTER — Encounter
Admission: RE | Admit: 2023-12-29 | Discharge: 2023-12-29 | Disposition: A | Discharge status: Home / Self Care | Source: Ambulatory Visit | Attending: Acute Care | Admitting: Acute Care

## 2023-12-29 ENCOUNTER — Other Ambulatory Visit: Payer: Self-pay

## 2023-12-29 VITALS — BP 122/83 | HR 95 | Resp 16 | Ht 74.0 in | Wt 194.0 lb

## 2023-12-29 DIAGNOSIS — G5731 Lesion of lateral popliteal nerve, right lower limb: Secondary | ICD-10-CM | POA: Insufficient documentation

## 2023-12-29 DIAGNOSIS — M21371 Foot drop, right foot: Secondary | ICD-10-CM | POA: Diagnosis not present

## 2023-12-29 DIAGNOSIS — Z01818 Encounter for other preprocedural examination: Secondary | ICD-10-CM | POA: Insufficient documentation

## 2023-12-29 LAB — SURGICAL PCR SCREEN
MRSA, PCR: NEGATIVE
Staphylococcus aureus: POSITIVE — AB

## 2023-12-29 LAB — TYPE AND SCREEN
ABO/RH(D): A POS
Antibody Screen: NEGATIVE

## 2023-12-29 NOTE — Patient Instructions (Signed)
 Your procedure is scheduled on: Thursday 01/05/24 Report to the Registration Desk on the 1st floor of [facility]. To find out your arrival time, please call [phone] between 1PM - 3PM on: Wednesday 01/04/24 If your arrival time is 6:00 am, do not arrive before that time as the [facility] entrance doors do not open until 6:00 am.  REMEMBER: Instructions that are not followed completely may result in serious medical risk, up to and including death; or upon the discretion of your surgeon and anesthesiologist your surgery may need to be rescheduled.  Do not eat food after midnight the night before surgery.  No gum chewing or hard candies.  You may however, drink CLEAR liquids up to 2 hours before you are scheduled to arrive for your surgery. Do not drink anything within 2 hours of your scheduled arrival time.  Clear liquids include: - water   **Type 1 and Type 2 diabetics should only drink water .**  One week prior to surgery: Stop Anti-inflammatories (NSAIDS) such as Advil , Aleve, Ibuprofen , Motrin , Naproxen, Naprosyn and Aspirin  based products such as Excedrin, Goody's Powder, BC Powder.  You may however, continue to take Tylenol  if needed for pain up until the day of surgery.  Stop ANY OVER THE COUNTER supplements and vitamins until after surgery.  **Follow guidelines for insulin  and diabetes medications.** JARDIANCE, last dose Sun 01/01/24. RYBELSUS, last dose Tue 01/03/24  **Follow recommendations regarding stopping blood thinners.** ELIQUIS , last dose Sun 01/01/24  Continue taking all of your other prescription medications up until the day of surgery.  ON THE DAY OF SURGERY ONLY TAKE THESE MEDICATIONS WITH SIPS OF WATER :  Metoprolol  Pantoprazole Pregabalin  No Alcohol for 24 hours before or after surgery.  No Smoking including e-cigarettes for 24 hours before surgery.  No chewable tobacco products for at least 6 hours before surgery.  No nicotine patches on the day of  surgery.  Do not use any recreational drugs for at least a week (preferably 2 weeks) before your surgery.  Please be advised that the combination of cocaine and anesthesia may have negative outcomes, up to and including death. If you test positive for cocaine, your surgery will be cancelled.  On the morning of surgery brush your teeth with toothpaste and water , you may rinse your mouth with mouthwash if you wish. Do not swallow any toothpaste or mouthwash.  Use CHG Soap or wipes as directed on instruction sheet.  Do not wear lotions, powders, or perfumes/cologne  Do not shave body hair from the neck down 48 hours before surgery. You may shave your face.  Wear comfortable clothing (specific to your surgery type) to the hospital.  Do not wear jewelry, make-up, hairpins, clips or nail polish.  For welded (permanent) jewelry: bracelets, anklets, waist bands, etc.  Please have this removed prior to surgery.  If it is not removed, there is a chance that hospital personnel will need to cut it off on the day of surgery.  Contact lenses, hearing aids and dentures may not be worn into surgery.  Do not bring valuables to the hospital. [facility] is not responsible for any missing/lost belongings or valuables.   Notify your doctor if there is any change in your medical condition (cold, fever, infection).  After surgery, you can help prevent lung complications by doing breathing exercises.  Take deep breaths and cough every 1-2 hours. Your doctor may order a device called an Incentive Spirometer to help you take deep breaths.  If you are  being discharged the day of surgery, you will not be allowed to drive home. You will need a responsible individual to drive you home and stay with you for 24 hours after surgery.   Please call the Pre-admissions Testing Dept. at [phone] if you have any questions about these instructions.  Surgery Visitation Policy:  Patients having surgery or a  procedure may have two visitors.  Children under the age of 21 must have an adult with them who is not the patient.   Merchandiser, retail to address health-related social needs:  [email or web address]                                                                                                             Preparing for Surgery with CHLORHEXIDINE GLUCONATE (CHG) Soap  Chlorhexidine Gluconate (CHG) Soap  o An antiseptic cleaner that kills germs and bonds with the skin to continue killing germs even after washing  o Used for showering the night before surgery and morning of surgery  Before surgery, you can play an important role by reducing the number of germs on your skin.  CHG (Chlorhexidine gluconate) soap is an antiseptic cleanser which kills germs and bonds with the skin to continue killing germs even after washing.  Please do not use if you have an allergy to CHG or antibacterial soaps. If your skin becomes reddened/irritated stop using the CHG.  1. Shower the NIGHT BEFORE SURGERY and the MORNING OF SURGERY with CHG soap.  2. If you choose to wash your hair, wash your hair first as usual with your normal shampoo.  3. After shampooing, rinse your hair and body thoroughly to remove the shampoo.  4. Use CHG as you would any other liquid soap. You can apply CHG directly to the skin and wash gently with a scrungie or a clean washcloth.  5. Apply the CHG soap to your body only from the neck down. Do not use on open wounds or open sores. Avoid contact with your eyes, ears, mouth, and genitals (private parts). Wash face and genitals (private parts) with your normal soap.  6. Wash thoroughly, paying special attention to the area where your surgery will be performed.  7. Thoroughly rinse your body with warm water .  8. Do not shower/wash with your normal soap after using and rinsing off the CHG soap.  9. Pat yourself dry with a clean towel.  10. Wear clean  pajamas to bed the night before surgery.  12. Place clean sheets on your bed the night of your first shower and do not sleep with pets.  13. Shower again with the CHG soap on the day of surgery prior to arriving at the hospital.  14. Do not apply any deodorants/lotions/powders.  15. Please wear clean clothes to the hospital.

## 2024-01-03 ENCOUNTER — Encounter: Payer: Self-pay | Admitting: Neurosurgery

## 2024-01-03 NOTE — Progress Notes (Signed)
 Perioperative / Anesthesia Services  Pre-Admission Testing Clinical Review / Pre-Operative Anesthesia Consult  Date: 01/03/24  PATIENT DEMOGRAPHICS: Name: Joseph Mann DOB: 03/28/1941 MRN:   [id number]  Note: Available PAT nursing documentation and vital signs have been reviewed. Clinical nursing staff has updated patient's PMH/PSHx, current medication list, and drug allergies/intolerances to ensure complete and comprehensive history available to assist care teams in MDM as it pertains to the aforementioned surgical procedure and anticipated anesthetic course. Extensive review of available clinical information personally performed. [place] [facility] and PSH[facility] updated with any diagnoses/procedures that  may have been inadvertently omitted during his intake with the pre-admission testing department's nursing staff.  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 male who is submitted for pre-surgical anesthesia review and clearance prior to him undergoing the above procedure. Patient has never been a smoker in the past. Pertinent PMH includes: CAD, NSTEMI, PAF, AAA, anginal syndrome, aortic atherosclerosis, HTN, HLD, T2DM, pulmonary hypertension, GERD (on daily PPI), hiatal hernia, OA, lumbar DDD, RIGHT peroneal neuropathy, RIGHT foot drop, BPH, depression.  Patient is followed by cardiology Philippe, MD). He was last seen in the cardiology clinic on 05/18/2023; notes reviewed. At the time of his clinic visit, patient doing well overall from a  cardiovascular perspective.  Patient complained of some mild shortness of breath, however noted that it was stable and at baseline overall.  Patient denied any chest pain, PND, orthopnea, palpitations, significant peripheral edema, weakness, fatigue, vertiginous symptoms, or presyncope/syncope. Patient with a past medical history significant for cardiovascular diagnoses. Documented physical exam was grossly benign, providing no evidence of acute exacerbation and/or decompensation of the patient's known cardiovascular conditions.  Patient suffered an NSTEMI on 08/06/2013.  He underwent diagnostic LEFT heart catheterization the following day (08/07/2013) revealing multivessel CAD: 10% distal LM, 99% proximal LAD, 20% LCx, and 20% ramus intermedius.  PCI was subsequently performed placing a 4.0 x 23 mm Xience Alpine DES to the proximal LAD lesion yielding excellent angiographic result and TIMI-3 flow.  Repeat diagnostic LEFT heart catheterization was performed on 05/15/2015.  Study demonstrated a 40% lesion in the distal LAD.  Previously placed stent to the proximal LAD widely patent.  Study suggestive of noncardiac chest pain.  Secondary prevention through medical management was recommended.  Most recent myocardial perfusion imaging study was performed on 11/18/2021 revealing a  normal left ventricular systolic function with an EF of 61%.  There were no regional wall motion abnormalities.  No artifact or left ventricular cavity size enlargement appreciated on review of imaging. SPECT images demonstrated no evidence of stress-induced myocardial ischemia or arrhythmia; no scintigraphic evidence of scar.  TID ratio = 1.08 (normal range </= 1.2). Study determined to be normal and low risk.  Most recent TTE performed on 11/18/2021 revealed a normal left ventricular systolic function with an EF of >55% %. There were no regional wall motion abnormalities.  Left ventricular diastolic Doppler parameters were normal.   There was severe LEFT and mild RIGHT atrial enlargement.  Right ventricular size and function normal with a TAPSE measuring 1.6 cm  (normal range >/= 1.6 cm).  RVSP = 37.1 mmHg consistent with mild pulmonary hypertension.  There was mild aortic valve sclerosis.  There was mild aortic, moderate mitral, moderate tricuspid, and mild pulmonary valve regurgitation.  All transvalvular gradients were noted to be normal providing no evidence of hemodynamically significant valvular stenosis. Aorta normal in size with no evidence of ectasia or aneurysmal dilatation.  Patient underwent diagnostic RIGHT/LEFT heart catheterization on 01/19/2022.  Study revealed single-vessel CAD; 40% distal LAD, 10% ostial to proximal LAD, and 50% mid LAD-2.  Previously placed stent to mid LAD-1 widely patent.  Hemodynamics: mean RA = 5 mmHg, mean PA = 21 mmHg, mean PCWP = 8 mmHg, CO = 5.58 L/min, and CI = 2.59 L/min/m.  Findings consistent with mild pulmonary hypertension.  Medical management was recommended.  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  Aneurysmal defect was last imaged on MRI of her lumbar spine performed on 06/27/2023, at which time defect measured up to 3.7 cm.  Patient with an atrial fibrillation diagnosis; CHA2DS2-VASc Score = 5 (age x 2, HTN, prior MI/vascular disease, T2DM). His rate and rhythm are currently being maintained on oral metoprolol  succinate. He is chronically anticoagulated using apixaban ; reported to be compliant with therapy with no evidence or reports of GI/GU bleeding.  Blood pressure well controlled at 120/68 mmHg on currently prescribed beta-blocker (metoprolol  succinate) monotherapy. He is on pravastatin  for his HLD diagnosis and further ASCVD prevention.  At the time of this visit with cardiology, T2DM reasonably controlled on currently prescribed regimen as evidenced by an A1c of 7.0% when checked on 01/03/2023.  Of note, since patient was last seen, he has  had his A1c level rechecked multiple times.  Most recent hemoglobin A1c was 8.3% when checked on 12/27/2023. Patient is able to complete all of his  ADL/IADLs without cardiovascular limitation.  Per the DASI, patient is able to achieve at least 4 METS of physical activity without experiencing any significant degree of angina/anginal equivalent symptoms. No changes were made to his medication regimen during his visit with cardiology.  Patient scheduled to follow-up with outpatient cardiology in 12 months or sooner if needed.  Joseph Mann is scheduled for an elective PERONEAL NERVE DECOMPRESSION (Right) on 01/05/2024 with Dr. Penne LELON Sharps, MD. Given patient's past medical history significant for cardiovascular diagnoses, presurgical cardiac clearance was sought by the PAT team. Per cardiology, this patient is optimized for surgery and may proceed with the planned procedural course with a LOW risk of significant perioperative cardiovascular complications.  Again, this patient is on daily oral anticoagulation therapy using a DOAC medication.  He has been instructed on recommendations for holding his apixaban  for 3 days prior to his procedure with plans to restart as soon as postoperative bleeding risk felt to be minimized by his primary attending surgeon. The patient has been instructed that his last dose should be on 01/01/2024.  Patient denies previous perioperative complications with anesthesia in the past. In review his EMR, it is noted that patient underwent a general anesthetic course here at [facility] (ASA III) in 06/2022 without documented complications.   MOST RECENT VITAL SIGNS:    12/29/2023   10:46 AM 12/05/2023   10:13 AM 10/04/2023    1:44 PM  Vitals with BMI  Height 6' 2 6' 2 6' 2  Weight 194 lbs 193 lbs 2  oz 195 lbs  BMI 24.9 24.79 25.03  Systolic 122 104 877  Diastolic 83 80 76  Pulse 95     PROVIDERS/SPECIALISTS: NOTE: Primary physician  provider listed below. Patient may have been seen by APP or partner within same practice.   PROVIDER ROLE / SPECIALTY LAST OV  Claudene Penne ORN, MD Neurosurgery (Surgeon) 12/05/2023  Lenon Layman ORN, MD Primary Care Provider 11/18/2023  Florencio Shine, MD Cardiology 05/18/2023  Cherilyn Ned, MD Endocrinology 12/27/2023  Tobie Breslow, MD Neurology 11/10/2023  Dodson Nest, MD Physiatry 07/12/2023   ALLERGIES: Allergies  Allergen Reactions   Amoxicillin Hives   Iodinated Contrast Media Hives   Lipitor [Atorvastatin]    Losartan  Other (See Comments)    weakness   Plavix [Clopidogrel] Hives    CURRENT HOME MEDICATIONS: No current facility-administered medications for this encounter.    apixaban  (ELIQUIS ) 5 MG TABS tablet   empagliflozin (JARDIANCE) 25 MG TABS tablet   metoprolol  succinate (TOPROL -XL) 25 MG 24 hr tablet   pantoprazole (PROTONIX) 40 MG tablet   pravastatin  (PRAVACHOL ) 20 MG tablet   pregabalin (LYRICA) 25 MG capsule   psyllium (HYDROCIL/METAMUCIL) 95 % PACK   acetaminophen  (TYLENOL ) 500 MG tablet   diphenhydrAMINE  (BENADRYL ) 50 MG tablet   tirzepatide (MOUNJARO) 2.5 MG/0.5ML Pen   HISTORY: Past Medical History:  Diagnosis Date   AAA (abdominal aortic aneurysm)    Adenomatous colon polyp    Angina pectoris syndrome    Aortic atherosclerosis    BPH (benign prostatic hyperplasia)    Coronary artery disease    a.) s/p NSTEMI 08/06/2013 --> PCI 99% pLAD (4.0 x 23 mm Xience Alpine DES)   DDD (degenerative disc disease), lumbar    Depression    Diverticulosis    GERD (gastroesophageal reflux disease)    Hiatal hernia    Hyperlipidemia    Hypertension    Migraines    Multiple BILATERAL renal sinus cysts    NSTEMI (non-ST elevated myocardial infarction) (HCC) 08/06/2013   a.) LHC/PCI 08/08/2023 --> IRA was 99% pLAD (4.0 x 23 mm Xience Alpine DES)   On apixaban  therapy    Osteoarthritis    PAF (paroxysmal atrial fibrillation) (HCC)    a.)  CHA2DS2VASc = 5 (age x 2, HTN, prior MI/vascular disease, T2DM) as of 01/03/2024; b.) rate/rhythm maintained on oral metoprolol  succinate; chronically anticoagulated with apixaban    Peroneal neuropathy at knee, right    Pulmonary hypertension (HCC)    Right foot drop    Status post right inguinal hernia repair    T2DM (type 2 diabetes mellitus) (HCC)    Past Surgical History:  Procedure Laterality Date   BACK SURGERY     CARDIAC CATHETERIZATION Left 05/15/2015   Procedure: Left Heart Cath and Coronary Angiography;  Surgeon: Cara JONETTA Florencio, MD;  Location: [facility] INVASIVE CV LAB;  Service: Cardiovascular;  Laterality: Left;   CHOLECYSTECTOMY     COLONOSCOPY N/A 03/10/2015   Procedure: COLONOSCOPY;  Surgeon: Lamar ONEIDA Holmes, MD;  [facility] END[facility]COPY;  Service: Endoscopy;  Laterality: N/A;   IDDM  Vancomycin / Gentamycin Preop   CORONARY ANGIOPLASTY WITH STENT PLACEMENT Left 08/07/2013   Procedure: CORONARY ANGIOPLASTY WITH STENT PLACEMENT; Location: [facility]; Surgeon: Shine Florencio, MD   ESOPHAGOGASTRODUODENOSCOPY (EGD) WITH PROPOFOL  N/A 05/22/2015   Procedure: ESOPHAGOGASTRODUODENOSCOPY (EGD) WITH PROPOFOL ;  Surgeon: Lamar ONEIDA Holmes, MD;  Location: [facility] ENDOSCOPY;  Service: Endoscopy;  Laterality: N/A;   ESOPHAGOGASTRODUODENOSCOPY (EGD) WITH PROPOFOL  N/A 07/05/2022   Procedure: ESOPHAGOGASTRODUODENOSCOPY (EGD) WITH PROPOFOL ;  Surgeon: Onita Standing  Ozell, DO;  Location: [facility] ENDOSCOPY;  Service: Endoscopy;  Laterality: N/A;   INGUINAL HERNIA REPAIR Right    REPLACEMENT TOTAL KNEE Left    RIGHT/LEFT HEART CATH AND CORONARY ANGIOGRAPHY N/A 01/19/2022   Procedure: RIGHT/LEFT HEART CATH AND CORONARY ANGIOGRAPHY;  Surgeon: Florencio Cara BIRCH, MD;  Location: [facility] INVASIVE CV LAB;  Service: Cardiovascular;  Laterality: N/A;   No family history on file. Social History   Tobacco Use   Smoking status: Never   Smokeless tobacco: Never  Substance Use Topics   Alcohol use: No    Comment:  occasional   LABS:  Component Ref Range & Units 11/21/2023  WBC (White Blood Cell Count) 4.1 - 10.2 10^3/uL 4.1  RBC (Red Blood Cell Count) 4.69 - 6.13 10^6/uL 3.75 Low   Hemoglobin 14.1 - 18.1 gm/dL 86.9 Low   Hematocrit 59.9 - 52.0 % 38.4 Low   MCV (Mean Corpuscular Volume) 80.0 - 100.0 fl 102.4 High   MCH (Mean Corpuscular Hemoglobin) 27.0 - 31.2 pg 34.7 High   MCHC (Mean Corpuscular Hemoglobin Concentration) 32.0 - 36.0 gm/dL 66.0  Platelet Count 849 - 450 10^3/uL 159  RDW-CV (Red Cell Distribution Width) 11.6 - 14.8 % 13.3  MPV (Mean Platelet Volume) 9.4 - 12.4 fl 11.2  Neutrophils 1.50 - 7.80 10^3/uL 2.09  Lymphocytes 1.00 - 3.60 10^3/uL 1.34  Monocytes 0.00 - 1.50 10^3/uL 0.42  Eosinophils 0.00 - 0.55 10^3/uL 0.15  Basophils 0.00 - 0.09 10^3/uL 0.07  Neutrophil % 32.0 - 70.0 % 51.3  Lymphocyte % 10.0 - 50.0 % 32.8  Monocyte % 4.0 - 13.0 % 10.3  Eosinophil % 1.0 - 5.0 % 3.7  Basophil% 0.0 - 2.0 % 1.7  Immature Granulocyte % <=0.7 % 0.2  Immature Granulocyte Count <=0.06 10^3/L 0.01   Component Ref Range & Units 11/21/2023  Glucose 70 - 110 mg/dL 827 High   Sodium 863 - 145 mmol/L 141  Potassium 3.6 - 5.1 mmol/L 4.4  Chloride 97 - 109 mmol/L 103  Carbon Dioxide (CO2) 22.0 - 32.0 mmol/L 28.8  Urea Nitrogen (BUN) 7 - 25 mg/dL 17  Creatinine 0.7 - 1.3 mg/dL 1.1  Glomerular Filtration Rate (eGFR) >60 mL/min/1.73sq m 67  Calcium 8.7 - 10.3 mg/dL 9.1  AST 8 - 39 U/L 14  ALT 6 - 57 U/L 17  Alk Phos (alkaline Phosphatase) 34 - 104 U/L 87  Albumin 3.5 - 4.8 g/dL 4.4  Bilirubin, Total 0.3 - 1.2 mg/dL 1.0  Protein, Total 6.1 - 7.9 g/dL 6.4  A/G Ratio 1.0 - 5.0 gm/dL 2.2   Component Ref Range & Units 12/27/2023  Hemoglobin A1C 4.2 - 5.6 % 8.3 High   Average Blood Glucose (Calc) mg/dL 807   Hospital Outpatient Visit on 12/29/2023  Component Date Value Ref Range Status   MRSA, PCR 12/29/2023 NEGATIVE  NEGATIVE Final    Staphylococcus aureus 12/29/2023 POSITIVE (A)  NEGATIVE Final   Comment: (NOTE) The Xpert SA Assay (FDA approved for NASAL specimens in patients 40 years of age and older), is one component of a comprehensive surveillance program. It is not intended to diagnose infection nor to guide or monitor treatment. Performed at [facility], [address] [place], [place] [address]    ABO/RH(D) 12/29/2023 A POS   Final   Antibody Screen 12/29/2023 NEG   Final   Sample Expiration 12/29/2023 01/12/2024,2359   Final   Extend sample reason 12/29/2023    Final  Value:NO TRANSFUSIONS OR PREGNANCY IN THE PAST 3 MONTHS Performed at [facility], [address] [place], [place] [address]     ECG: Date: 12/29/2023  Time ECG obtained: 1128 AM Rate: 86 bpm Rhythm: atrial fibrillation Axis (leads I and aVF): normal Intervals: QRS 76 ms. QTc 426 ms. ST segment and T wave changes: No evidence of acute T wave abnormalities or significant ST segment elevation or depression.  Evidence of a possible, age undetermined, prior infarct:  No Comparison: Similar to previous tracing obtained on 01/18/2023   IMAGING / PROCEDURES: MR KNEE RIGHT WO CONTRAST performed on 12/09/2023 Tricompartmental osteoarthrosis with full-thickness cartilage loss the medial compartment. Post meniscectomy changes in the medial meniscus. There is fraying and fragmentation of the posterior horn and body of the medial meniscus with a small apical tears. These are of uncertain clinical significance There is a dilated probable popliteal artery which is unchanged compared with 2008 making this is of uncertain clinical significance. There is a slight beaded appearance which could conceivably represent fibromuscular dysplasia. Correlation with popliteal ultrasound to further evaluate the vascular structures if clinically indicated.  MR LUMBAR SPINE WO CONTRAST performed on 06/27/2023 Mild canal stenoses  at several levels in the lumbar spine secondary to disc bulging and facet arthropathy. Moderate and severe foraminal stenoses throughout the lumbar spine secondary to disc bulging and facet arthropathy as detailed above. Aneurysmal dilatation of the abdominal aorta measuring up to 3.7 cm.  CT HEMATURIA WORKUP performed on 03/18/2023 No hydronephrosis on either side. There are multiple bilateral renal sinus cysts, as described above. No nephroureterolithiasis or obstructive uropathy. No suspicious renal, ureteric or urinary bladder mass. There is fusiform aneurysmal dilation of infrarenal aorta measuring up to 3.4 cm. Recommend follow-up every 3 years.  Mildly enlarged prostate gland with median lobe projecting into the bladder base. Correlate with serum PSA levels.  Sliding hiatal hernia  There are 2 adjacent diverticula arising from the second part of duodenum with largest measuring up to 2.1 x 2.5 cm There are multiple diverticula throughout the colon, without imaging signs of diverticulitis. Aortic atherosclerosis  RIGHT/LEFT HEART CATHETERIZATION AND CORONARY ANGIOGRAPHY performed on 01/19/2022 Normal left ventricular systolic function with an EF of 55% Multivessel CAD 40% distal LAD 10% ostial to proximal LAD 50% mid LAD-2 Previously placed stent to mid LAD-1 widely patent Hemodynamics Mean RA = 5 mmHg Mean PA = 21 mmHg Mean PCWP = 8 mmHg CO = 5.58 L/min CI = 2.59 L/min/m Recommendations: Findings consistent with mild pulmonary hypertension.  Conservative medical therapy recommended.   MYOCARDIAL PERFUSION IMAGING STUDY (LEXISCAN ) performed on 06/11/2017 Mildly reduced left ventricular systolic function with an EF of 45-54% No regional wall motion abnormalities No ST segment deviation noted during stress Blood pressure demonstrated normal response to exercise Normal low restudy  IMPRESSION AND PLAN: Joseph Mann has been referred for pre-anesthesia review and clearance  prior to him undergoing the planned anesthetic and procedural courses. Available labs, pertinent testing, and imaging results were personally reviewed by me in preparation for upcoming operative/procedural course. [facility] medical record has been updated following extensive record review and patient interview with PAT staff.   This patient has been appropriately cleared by cardiology with an overall LOW risk of patient experiencing significant perioperative cardiovascular complications. Based on clinical review performed today (01/03/24), barring any significant acute changes in the patient's overall condition, it is anticipated that he will be able to proceed with the planned surgical intervention. Any acute changes in clinical  condition may necessitate his procedure being postponed and/or cancelled. Patient will meet with anesthesia team (MD and/or CRNA) on the day of his procedure for preoperative evaluation/assessment. Questions regarding anesthetic course will be fielded at that time.   Pre-surgical instructions were reviewed with the patient during his PAT appointment, and questions were fielded to satisfaction by PAT clinical staff. He has been instructed on which medications that he will need to hold prior to surgery, as well as the ones that have been deemed safe/appropriate to take on the day of his procedure. As part of the general education provided by PAT, patient made aware both verbally and in writing, that he would need to abstain from the use of any illegal substances during his perioperative course. He was advised that failure to follow the provided instructions could necessitate case cancellation or result in serious perioperative complications up to and including death. Patient encouraged to contact PAT and/or his surgeon's office to discuss any questions or concerns that may arise prior to surgery; verbalized understanding.   Dorise Pereyra, MSN, APRN, FNP-C, CEN [facility]  Perioperative Services Nurse Practitioner Phone: [phone] Fax: [phone] 01/03/24 12:58 PM  NOTE: This note has been prepared using Dragon dictation software. Despite my best ability to proofread, there is always the potential that unintentional transcriptional errors may still occur from this process.

## 2024-01-03 NOTE — Progress Notes (Signed)
  Perioperative Services: Pre-Admission/Anesthesia Testing  Abnormal Lab Notification    Date: 01/03/24  Name: Joseph Mann MRN:   [id number]  Re: Abnormal labs noted during PAT appointment   Provider(s) Notified: Claudene Penne ORN, MD Notification mode: Routed and/or faxed via [facility]  Planned procedure: PERONEAL NERVE DECOMPRESSION (Right)   ABNORMAL LAB VALUE(S): Lab Results  Component Value Date   GLUCOSE 225 (H) 06/10/2017    Notes: Patient with a T2DM diagnosis. He is currently on oral therapies including SGLT2i (empagliflozin) and GLP-1/GIP (tirzepatide). Last Hgb A1c was 8.3% on 12/27/2023. In efforts to reduce the risk of developing SSI, or other potential perioperative complications, this communication is being sent in order to determine if patient is deemed to be adequately optimized  for surgery.   In light of the aforementioned A1c, his diabetes could pose increased risks for the patient during their perioperative course and subsequent recovery period. With that being said, the benefit of improving glycemic control must be weighed against the overall risks associated with delaying a necessary elective surgical procedure for this patient.   This is a preoperative FYI. No formal response indicated if there are no proposed changes to patient's surgical plan of care. Should there be changes, please let me know.   Dorise Pereyra, MSN, APRN, FNP-C, CEN Operating Room Services  Perioperative Services Nurse Practitioner Phone: [phone] Fax: [phone] 01/03/24 12:41 PM

## 2024-01-04 ENCOUNTER — Ambulatory Visit (INDEPENDENT_AMBULATORY_CARE_PROVIDER_SITE_OTHER): Admitting: Physician Assistant

## 2024-01-04 DIAGNOSIS — M21371 Foot drop, right foot: Secondary | ICD-10-CM | POA: Diagnosis not present

## 2024-01-04 NOTE — Progress Notes (Signed)
 Patient is due to have a right peroneal nerve decompression tomorrow.  In his preoperative labs he did have an elevated A1c at 8.3.  I discussed this with the patient and let him know that this increased his risk of infection and postoperative complications.  He states that he understands and would still like to proceed forward with surgery regardless.  We are in agreement.  Plan for peroneal nerve decompression tomorrow as planned.   This visit was performed via telephone.  Patient location: home Provider location: office  I spent a total of 5 minutes non-face-to-face activities for this visit on the date of this encounter including review of current clinical condition and response to treatment.  The patient is aware of and accepts the limits of this telehealth visit.

## 2024-01-05 ENCOUNTER — Other Ambulatory Visit: Payer: Self-pay

## 2024-01-05 ENCOUNTER — Ambulatory Visit: Payer: Self-pay | Admitting: Urgent Care

## 2024-01-05 ENCOUNTER — Encounter
Admission: RE | Disposition: A | Discharge status: Home / Self Care | Payer: Self-pay | Source: Home / Self Care | Attending: Neurosurgery

## 2024-01-05 ENCOUNTER — Ambulatory Visit
Admission: RE | Admit: 2024-01-05 | Discharge: 2024-01-05 | Disposition: A | Discharge status: Home / Self Care | Attending: Neurosurgery | Admitting: Neurosurgery

## 2024-01-05 ENCOUNTER — Encounter: Payer: Self-pay | Admitting: Neurosurgery

## 2024-01-05 DIAGNOSIS — I25119 Atherosclerotic heart disease of native coronary artery with unspecified angina pectoris: Secondary | ICD-10-CM | POA: Diagnosis not present

## 2024-01-05 DIAGNOSIS — Z955 Presence of coronary angioplasty implant and graft: Secondary | ICD-10-CM | POA: Diagnosis not present

## 2024-01-05 DIAGNOSIS — K219 Gastro-esophageal reflux disease without esophagitis: Secondary | ICD-10-CM | POA: Diagnosis not present

## 2024-01-05 DIAGNOSIS — I10 Essential (primary) hypertension: Secondary | ICD-10-CM | POA: Insufficient documentation

## 2024-01-05 DIAGNOSIS — I480 Paroxysmal atrial fibrillation: Secondary | ICD-10-CM | POA: Insufficient documentation

## 2024-01-05 DIAGNOSIS — Z7984 Long term (current) use of oral hypoglycemic drugs: Secondary | ICD-10-CM | POA: Diagnosis not present

## 2024-01-05 DIAGNOSIS — G5731 Lesion of lateral popliteal nerve, right lower limb: Secondary | ICD-10-CM | POA: Insufficient documentation

## 2024-01-05 DIAGNOSIS — Z7985 Long-term (current) use of injectable non-insulin antidiabetic drugs: Secondary | ICD-10-CM | POA: Insufficient documentation

## 2024-01-05 DIAGNOSIS — I272 Pulmonary hypertension, unspecified: Secondary | ICD-10-CM | POA: Diagnosis not present

## 2024-01-05 DIAGNOSIS — Z79899 Other long term (current) drug therapy: Secondary | ICD-10-CM | POA: Diagnosis not present

## 2024-01-05 DIAGNOSIS — I252 Old myocardial infarction: Secondary | ICD-10-CM | POA: Diagnosis not present

## 2024-01-05 DIAGNOSIS — M21371 Foot drop, right foot: Secondary | ICD-10-CM | POA: Diagnosis present

## 2024-01-05 DIAGNOSIS — E119 Type 2 diabetes mellitus without complications: Secondary | ICD-10-CM | POA: Diagnosis not present

## 2024-01-05 DIAGNOSIS — Z01818 Encounter for other preprocedural examination: Secondary | ICD-10-CM

## 2024-01-05 HISTORY — DX: Other intervertebral disc degeneration, lumbar region without mention of lumbar back pain or lower extremity pain: M51.369

## 2024-01-05 HISTORY — DX: Lesion of lateral popliteal nerve, right lower limb: G57.31

## 2024-01-05 HISTORY — DX: Diaphragmatic hernia without obstruction or gangrene: K44.9

## 2024-01-05 HISTORY — DX: Congenital multiple renal cysts: Q61.02

## 2024-01-05 HISTORY — DX: Angina pectoris, unspecified: I20.9

## 2024-01-05 HISTORY — DX: Unspecified osteoarthritis, unspecified site: M19.90

## 2024-01-05 HISTORY — DX: Paroxysmal atrial fibrillation: I48.0

## 2024-01-05 HISTORY — PX: PERONEAL NERVE DECOMPRESSION: SHX2226

## 2024-01-05 HISTORY — DX: Pulmonary hypertension, unspecified: I27.20

## 2024-01-05 HISTORY — DX: Long term (current) use of anticoagulants: Z79.01

## 2024-01-05 HISTORY — DX: Benign neoplasm of colon, unspecified: D12.6

## 2024-01-05 HISTORY — DX: Type 2 diabetes mellitus without complications: E11.9

## 2024-01-05 HISTORY — DX: Foot drop, right foot: M21.371

## 2024-01-05 HISTORY — DX: Atherosclerosis of aorta: I70.0

## 2024-01-05 HISTORY — DX: Diverticulosis of intestine, part unspecified, without perforation or abscess without bleeding: K57.90

## 2024-01-05 HISTORY — DX: Personal history of other diseases of the digestive system: Z87.19

## 2024-01-05 HISTORY — DX: Migraine, unspecified, not intractable, without status migrainosus: G43.909

## 2024-01-05 LAB — ABO/RH: ABO/RH(D): A POS

## 2024-01-05 LAB — GLUCOSE, CAPILLARY
Glucose-Capillary: 176 mg/dL — ABNORMAL HIGH (ref 70–99)
Glucose-Capillary: 184 mg/dL — ABNORMAL HIGH (ref 70–99)

## 2024-01-05 SURGERY — PERONEAL NERVE DECOMPRESSION
Anesthesia: General | Laterality: Right

## 2024-01-05 MED ORDER — PHENYLEPHRINE 80 MCG/ML (10ML) SYRINGE FOR IV PUSH (FOR BLOOD PRESSURE SUPPORT)
PREFILLED_SYRINGE | INTRAVENOUS | Status: DC | PRN
  Administered 2024-01-05: 160 ug via INTRAVENOUS

## 2024-01-05 MED ORDER — CHLORHEXIDINE GLUCONATE 0.12 % MT SOLN
OROMUCOSAL | Status: AC
  Filled 2024-01-05: qty 15

## 2024-01-05 MED ORDER — DOCUSATE SODIUM 100 MG PO CAPS: 100.0000 mg | ORAL_CAPSULE | Freq: Two times a day (BID) | ORAL | 2 refills | Status: AC

## 2024-01-05 MED ORDER — LIDOCAINE HCL (CARDIAC) PF 100 MG/5ML IV SOSY
PREFILLED_SYRINGE | INTRAVENOUS | Status: DC | PRN
Start: 2024-01-05 — End: 2024-01-05
  Administered 2024-01-05: 40 mg via INTRAVENOUS

## 2024-01-05 MED ORDER — CEFAZOLIN SODIUM-DEXTROSE 2-4 GM/100ML-% IV SOLN
INTRAVENOUS | Status: AC
  Filled 2024-01-05: qty 100

## 2024-01-05 MED ORDER — PROPOFOL 500 MG/50ML IV EMUL
INTRAVENOUS | Status: DC | PRN
  Administered 2024-01-05: 80 ug/kg/min via INTRAVENOUS

## 2024-01-05 MED ORDER — 0.9 % SODIUM CHLORIDE (POUR BTL) OPTIME
TOPICAL | Status: DC | PRN
  Administered 2024-01-05: 500 mL

## 2024-01-05 MED ORDER — FENTANYL CITRATE (PF) 100 MCG/2ML IJ SOLN
INTRAMUSCULAR | Status: DC | PRN
  Administered 2024-01-05 (×4): 25 ug via INTRAVENOUS

## 2024-01-05 MED ORDER — TRAMADOL HCL 50 MG PO TABS: 50.0000 mg | ORAL_TABLET | Freq: Four times a day (QID) | ORAL | 0 refills | Status: AC | PRN

## 2024-01-05 MED ORDER — CEFAZOLIN IN SODIUM CHLORIDE 2-0.9 GM/100ML-% IV SOLN
2.0000 g | Freq: Once | INTRAVENOUS | Status: AC
  Administered 2024-01-05: 2 g via INTRAVENOUS
  Filled 2024-01-05: qty 100

## 2024-01-05 MED ORDER — SENNA 8.6 MG PO TABS: 1.0000 | ORAL_TABLET | Freq: Every day | ORAL | 0 refills | Status: AC

## 2024-01-05 MED ORDER — SODIUM CHLORIDE 0.9 % IV SOLN: INTRAVENOUS | Status: DC

## 2024-01-05 MED ORDER — FENTANYL CITRATE (PF) 100 MCG/2ML IJ SOLN
INTRAMUSCULAR | Status: AC
  Filled 2024-01-05: qty 2

## 2024-01-05 MED ORDER — CHLORHEXIDINE GLUCONATE 0.12 % MT SOLN
15.0000 mL | Freq: Once | OROMUCOSAL | Status: AC
  Administered 2024-01-05: 15 mL via OROMUCOSAL

## 2024-01-05 MED ORDER — ORAL CARE MOUTH RINSE: 15.0000 mL | Freq: Once | OROMUCOSAL | Status: AC

## 2024-01-05 MED ORDER — LIDOCAINE HCL (PF) 1 % IJ SOLN
INTRAMUSCULAR | Status: DC | PRN
  Administered 2024-01-05: 6 mL

## 2024-01-05 MED ORDER — ONDANSETRON HCL 4 MG/2ML IJ SOLN
INTRAMUSCULAR | Status: DC | PRN
  Administered 2024-01-05: 4 mg via INTRAVENOUS

## 2024-01-05 SURGICAL SUPPLY — 28 items
BRUSH SCRUB EZ 4% CHG (MISCELLANEOUS) ×2 IMPLANT
CHLORAPREP W/TINT 26 (MISCELLANEOUS) ×2 IMPLANT
CORD BIP STRL DISP 12FT (MISCELLANEOUS) ×2 IMPLANT
COVER PROBE FLX POLY STRL (MISCELLANEOUS) IMPLANT
DERMABOND ADVANCED .7 DNX12 (GAUZE/BANDAGES/DRESSINGS) ×2 IMPLANT
DRAPE INCISE IOBAN 66X45 STRL (DRAPES) ×2 IMPLANT
DRAPE LAPAROTOMY 77X122 PED (DRAPES) ×2 IMPLANT
DRAPE SURG 17X11 SM STRL (DRAPES) ×4 IMPLANT
DRSG OPSITE POSTOP 3X4 (GAUZE/BANDAGES/DRESSINGS) ×2 IMPLANT
FORCEPS JEWEL BIP 4-3/4 STR (INSTRUMENTS) ×2 IMPLANT
GAUZE SPONGE 4X4 12PLY STRL (GAUZE/BANDAGES/DRESSINGS) ×2 IMPLANT
GAUZE XEROFORM 1X8 LF (GAUZE/BANDAGES/DRESSINGS) ×2 IMPLANT
GLOVE BIOGEL PI IND STRL 8 (GLOVE) ×4 IMPLANT
GLOVE SURG SYN 7.5 PF PI (GLOVE) ×2 IMPLANT
GOWN STRL REUS W/ TWL LRG LVL3 (GOWN DISPOSABLE) ×2 IMPLANT
GOWN STRL REUS W/ TWL XL LVL3 (GOWN DISPOSABLE) ×2 IMPLANT
KIT TURNOVER KIT A (KITS) ×2 IMPLANT
MANIFOLD NEPTUNE II (INSTRUMENTS) ×2 IMPLANT
NDL HYPO 25X1 1.5 SAFETY (NEEDLE) ×2 IMPLANT
NEEDLE HYPO 25X1 1.5 SAFETY (NEEDLE) ×1 IMPLANT
NS IRRIG 500ML POUR BTL (IV SOLUTION) ×2 IMPLANT
PACK BASIN MINOR ARMC (MISCELLANEOUS) ×2 IMPLANT
SUT MNCRL AB 4-0 PS2 18 (SUTURE) IMPLANT
SUT STRATA 3-0 15 PS-2 (SUTURE) IMPLANT
SUT VIC AB 2-0 SH 27XBRD (SUTURE) ×2 IMPLANT
SUT VIC AB 3-0 SH 27X BRD (SUTURE) ×2 IMPLANT
TRAP FLUID SMOKE EVACUATOR (MISCELLANEOUS) ×2 IMPLANT
WATER STERILE IRR 500ML POUR (IV SOLUTION) ×2 IMPLANT

## 2024-01-05 NOTE — Interval H&P Note (Signed)
 History and Physical Interval Note:  01/05/2024 7:48 AM  Emil [name]  has presented today for surgery, with the diagnosis of Right Peroneal Neuropathy.  The various methods of treatment have been discussed with the patient and family. After consideration of risks, benefits and other options for treatment, the patient has consented to  Procedure(s) with comments: PERONEAL NERVE DECOMPRESSION (Right) - RIGHT PERONEAL NERVE DECOMPRESSION as a surgical intervention.  The patient's history has been reviewed, patient examined, no change in status, stable for surgery.  I have reviewed the patient's chart and labs.  Questions were answered to the patient's satisfaction.    Heart and lungs clear   Joseph Mann

## 2024-01-05 NOTE — Anesthesia Preprocedure Evaluation (Addendum)
 Anesthesia Evaluation  Patient identified by MRN, date of birth, ID band Patient awake    Reviewed: Allergy & Precautions, H&P , NPO status , Patient's Chart, lab work & pertinent test results  Airway Mallampati: II  TM Distance: >3 FB Neck ROM: full    Dental no notable dental hx.    Pulmonary neg pulmonary ROS   Pulmonary exam normal        Cardiovascular hypertension, pulmonary hypertension+ CAD, + Past MI and + Cardiac Stents  Normal cardiovascular exam+ dysrhythmias (pAF)    Patient suffered an NSTEMI on 08/06/2013.  He underwent diagnostic LEFT heart catheterization the following day (08/07/2013) revealing multivessel CAD: 10% distal LM, 99% proximal LAD, 20% LCx, and 20% ramus intermedius.  PCI was subsequently performed placing a 4.0 x 23 mm Xience Alpine DES to the proximal LAD lesion yielding excellent angiographic result and TIMI-3 flow.    Repeat diagnostic LEFT heart catheterization was performed on 05/15/2015.  Study demonstrated a 40% lesion in the distal LAD.  Previously placed stent to the proximal LAD widely patent.  Study suggestive of noncardiac chest pain.  Secondary prevention through medical management was recommended.    Most recent myocardial perfusion imaging study was performed on 11/18/2021 revealing a  normal left ventricular systolic function with an EF of 61%.  There were no regional wall motion abnormalities.  No artifact or left ventricular cavity size enlargement appreciated on review of imaging. SPECT images demonstrated no evidence of stress-induced myocardial ischemia or arrhythmia; no scintigraphic evidence of scar.  TID ratio = 1.08 (normal range </= 1.2). Study determined to be normal and low risk.    Most recent TTE performed on 11/18/2021 revealed a normal left ventricular systolic function with an EF of >55% %. There were no regional wall motion abnormalities.  Left ventricular diastolic Doppler  parameters were normal.  There was severe LEFT and mild RIGHT atrial enlargement.  Right ventricular size and function normal with a TAPSE measuring 1.6 cm  (normal range >/= 1.6 cm).  RVSP = 37.1 mmHg consistent with mild pulmonary hypertension.  There was mild aortic valve sclerosis.  There was mild aortic, moderate mitral, moderate tricuspid, and mild pulmonary valve regurgitation.  All transvalvular gradients were noted to be normal providing no evidence of hemodynamically significant valvular stenosis. Aorta normal in size with no evidence of ectasia or aneurysmal dilatation.    Patient underwent diagnostic RIGHT/LEFT heart catheterization on 01/19/2022.  Study revealed single-vessel CAD; 40% distal LAD, 10% ostial to proximal LAD, and 50% mid LAD-2.  Previously placed stent to mid LAD-1 widely patent.  Hemodynamics: mean RA = 5 mmHg, mean PA = 21 mmHg, mean PCWP = 8 mmHg, CO = 5.58 L/min, and CI = 2.59 L/min/m.  Findings consistent with mild pulmonary hypertension.  Medical management was recommended.    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  Aneurysmal defect was last imaged on MRI of her lumbar spine performed on 06/27/2023, at which time defect measured up to 3.7 cm.    Neuro/Psych  Neuromuscular disease  negative psych ROS   GI/Hepatic negative GI ROS, Neg liver ROS,,,  Endo/Other  diabetes, Type 2    Renal/GU negative Renal ROS  negative genitourinary   Musculoskeletal   Abdominal   Peds  Hematology  (+) Blood dyscrasia, anemia   Anesthesia Other Findings Past Medical History: No date: AAA (abdominal aortic aneurysm) No date: Adenomatous colon polyp No date: Angina pectoris syndrome No date: Aortic  atherosclerosis No date: BPH (benign prostatic hyperplasia) No date: Coronary artery disease     Comment:  a.) s/p NSTEMI 08/06/2013 --> PCI 99% pLAD (4.0 x 23 mm               Xience Alpine DES) No date: DDD (degenerative disc  disease), lumbar No date: Depression No date: Diverticulosis No date: GERD (gastroesophageal reflux disease) No date: Hiatal hernia No date: Hyperlipidemia No date: Hypertension No date: Migraines No date: Multiple BILATERAL renal sinus cysts 08/06/2013: NSTEMI (non-ST elevated myocardial infarction) (HCC)     Comment:  a.) LHC/PCI 08/08/2023 --> IRA was 99% pLAD (4.0 x 23 mm              Xience Alpine DES) No date: On apixaban  therapy No date: Osteoarthritis No date: PAF (paroxysmal atrial fibrillation) (HCC)     Comment:  a.) CHA2DS2VASc = 5 (age x 2, HTN, prior MI/vascular               disease, T2DM) as of 01/03/2024; b.) rate/rhythm               maintained on oral metoprolol  succinate; chronically               anticoagulated with apixaban  No date: Peroneal neuropathy at knee, right No date: Pulmonary hypertension (HCC) No date: Right foot drop No date: Status post right inguinal hernia repair No date: T2DM (type 2 diabetes mellitus) (HCC)  Past Surgical History: No date: BACK SURGERY 05/15/2015: CARDIAC CATHETERIZATION; Left     Comment:  Procedure: Left Heart Cath and Coronary Angiography;                Surgeon: Cara JONETTA Lovelace, MD;  Location: [facility] INVASIVE               CV LAB;  Service: Cardiovascular;  Laterality: Left; No date: CHOLECYSTECTOMY 03/10/2015: COLONOSCOPY; N/A     Comment:  Procedure: COLONOSCOPY;  Surgeon: Lamar ONEIDA Holmes, MD;               Location: [facility] ENDOSCOPY;  Service: Endoscopy;                Laterality: N/A;   IDDM  Vancomycin / Gentamycin Preop 08/07/2013: CORONARY ANGIOPLASTY WITH STENT PLACEMENT; Left     Comment:  Procedure: CORONARY ANGIOPLASTY WITH STENT PLACEMENT;               Location: [facility]; Surgeon: Margie Lovelace, MD 05/22/2015: ESOPHAGOGASTRODUODENOSCOPY (EGD) WITH PROPOFOL ; N/A     Comment:  Procedure: ESOPHAGOGASTRODUODENOSCOPY (EGD) WITH               PROPOFOL ;  Surgeon: Lamar ONEIDA Holmes, MD;  Location: [facility]               ENDOSCOPY;  Service: Endoscopy;  Laterality: N/A; 07/05/2022: ESOPHAGOGASTRODUODENOSCOPY (EGD) WITH PROPOFOL ; N/A     Comment:  Procedure: ESOPHAGOGASTRODUODENOSCOPY (EGD) WITH               PROPOFOL ;  Surgeon: Onita Elspeth Sharper, DO;  Location:              [facility] ENDOSCOPY;  Service: Endoscopy;  Laterality: N/A; No date: INGUINAL HERNIA REPAIR; Right No date: REPLACEMENT TOTAL KNEE; Left 01/19/2022: RIGHT/LEFT HEART CATH AND CORONARY ANGIOGRAPHY; N/A     Comment:  Procedure: RIGHT/LEFT HEART CATH AND CORONARY               ANGIOGRAPHY;  Surgeon: Lovelace Cara JONETTA, MD;  Location:  [facility] INVASIVE CV LAB;  Service: Cardiovascular;                Laterality: N/A;  BMI    Body Mass Index: 24.91 kg/m      Reproductive/Obstetrics negative OB ROS                              Anesthesia Physical Anesthesia Plan  ASA: 3  Anesthesia Plan: General   Post-op Pain Management: Regional block*   Induction: Intravenous  PONV Risk Score and Plan: Propofol  infusion and TIVA  Airway Management Planned: Natural Airway  Additional Equipment:   Intra-op Plan:   Post-operative Plan:   Informed Consent: I have reviewed the patients History and Physical, chart, labs and discussed the procedure including the risks, benefits and alternatives for the proposed anesthesia with the patient or authorized representative who has indicated his/her understanding and acceptance.     Dental Advisory Given  Plan Discussed with: CRNA and Surgeon  Anesthesia Plan Comments:          Anesthesia Quick Evaluation

## 2024-01-05 NOTE — Transfer of Care (Signed)
 Immediate Anesthesia Transfer of Care Note  Patient: Joseph Mann  Procedure(s) Performed: Procedure(s) with comments: PERONEAL NERVE DECOMPRESSION (Right) - RIGHT PERONEAL NERVE DECOMPRESSION  Patient Location: PACU  Anesthesia Type:General  Level of Consciousness: sedated  Airway & Oxygen Therapy: Patient Spontanous Breathing and Patient connected to face mask oxygen  Post-op Assessment: Report given to RN and Post -op Vital signs reviewed and stable  Post vital signs: Reviewed and stable  Last Vitals:  Vitals:   01/05/24 0722 01/05/24 0830  BP: 125/87 (!) 86/59  Pulse: 98 91  Resp: 16 13  Temp: 36.7 C 36.9 C  SpO2: 98% 100%    Complications: No apparent anesthesia complications

## 2024-01-05 NOTE — H&P (View-Only) (Signed)
 Referring Physician:  No referring provider defined for this encounter.  Primary Physician:  Lenon Layman ORN, MD  History of Present Illness: 01/05/2024 [name] is here today with a chief complaint of right-sided foot drop.  He has a history of chronic right lower extremity pain as well as outer thigh pain.  He has been having worsening foot drop and weakness in his right lower extremity.  Was seen by my partner Dr. Katrina who evaluated him and was concerned that he may have a peripheral issue rather than a clear spinal issue so he was sent to neurology.  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  He is here today to follow-up.  Continues to have weakness in the right lower extremity.  States that this weakness is the thing that bothers him the most.  He also gets tingling/weakness sensation in his right lower extremity that is quite bothersome.  He does get intermittent pain from his back down the posterior aspect of his leg but states that this is intermittent and is not his major concern.   Bowel/Bladder Dysfunction: none   Conservative measures:  Physical therapy: has not participated in PT Multimodal medical therapy including regular antiinflammatories: Prednisone , Gabapentin Injections: 06/24/2023 Right L5-S1 TESI 04/14/2023 Right L5-S1 TESI 02/17/2023 Right L5-S1 TESI   Past Surgery: R L4/5 surgery many years ago by Dr. Kathi Emil FORBES Mann has no symptoms of cervical myelopathy.   The symptoms are causing a significant impact on the patient's life.    I have utilized the care everywhere function in epic to review the outside records available from external health systems.    Review of Systems:  A 10 point review of systems is negative, except for the pertinent positives and negatives detailed in the HPI.  Past Medical History: Past Medical History:  Diagnosis Date   AAA (abdominal aortic  aneurysm)    Adenomatous colon polyp    Angina pectoris syndrome    Aortic atherosclerosis    BPH (benign prostatic hyperplasia)    Coronary artery disease    a.) s/p NSTEMI 08/06/2013 --> PCI 99% pLAD (4.0 x 23 mm Xience Alpine DES)   DDD (degenerative disc disease), lumbar    Depression    Diverticulosis    GERD (gastroesophageal reflux disease)    Hiatal hernia    Hyperlipidemia    Hypertension    Migraines    Multiple BILATERAL renal sinus cysts    NSTEMI (non-ST elevated myocardial infarction) (HCC) 08/06/2013   a.) LHC/PCI 08/08/2023 --> IRA was 99% pLAD (4.0 x 23 mm Xience Alpine DES)   On apixaban  therapy    Osteoarthritis    PAF (paroxysmal atrial fibrillation) (HCC)    a.) CHA2DS2VASc = 5 (age x 2, HTN, prior MI/vascular disease, T2DM) as of 01/03/2024; b.) rate/rhythm maintained on oral metoprolol  succinate; chronically anticoagulated with apixaban    Peroneal neuropathy at knee, right    Pulmonary hypertension (HCC)    Right foot drop    Status post right inguinal hernia repair    T2DM (type 2 diabetes mellitus) (HCC)     Past Surgical History: Past Surgical History:  Procedure Laterality Date   BACK SURGERY     CARDIAC CATHETERIZATION Left 05/15/2015   Procedure: Left Heart Cath and Coronary Angiography;  Surgeon: Cara JONETTA Lovelace, MD;  Location: [facility] INVASIVE CV LAB;  Service: Cardiovascular;  Laterality: Left;   CHOLECYSTECTOMY     COLONOSCOPY N/A  03/10/2015   Procedure: COLONOSCOPY;  Surgeon: Lamar ONEIDA Holmes, MD;  Location: [facility] ENDOSCOPY;  Service: Endoscopy;  Laterality: N/A;   IDDM  Vancomycin / Gentamycin Preop   CORONARY ANGIOPLASTY WITH STENT PLACEMENT Left 08/07/2013   Procedure: CORONARY ANGIOPLASTY WITH STENT PLACEMENT; Location: [facility]; Surgeon: Margie Lovelace, MD   ESOPHAGOGASTRODUODENOSCOPY (EGD) WITH PROPOFOL  N/A 05/22/2015   Procedure: ESOPHAGOGASTRODUODENOSCOPY (EGD) WITH PROPOFOL ;  Surgeon: Lamar ONEIDA Holmes, MD;  Location: [facility] ENDOSCOPY;   Service: Endoscopy;  Laterality: N/A;   ESOPHAGOGASTRODUODENOSCOPY (EGD) WITH PROPOFOL  N/A 07/05/2022   Procedure: ESOPHAGOGASTRODUODENOSCOPY (EGD) WITH PROPOFOL ;  Surgeon: Onita Elspeth Sharper, DO;  Location: [facility] ENDOSCOPY;  Service: Endoscopy;  Laterality: N/A;   INGUINAL HERNIA REPAIR Right    REPLACEMENT TOTAL KNEE Left    RIGHT/LEFT HEART CATH AND CORONARY ANGIOGRAPHY N/A 01/19/2022   Procedure: RIGHT/LEFT HEART CATH AND CORONARY ANGIOGRAPHY;  Surgeon: Lovelace Cara BIRCH, MD;  Location: [facility] INVASIVE CV LAB;  Service: Cardiovascular;  Laterality: N/A;    Allergies: Allergies as of 12/13/2023 - Review Complete 12/05/2023  Allergen Reaction Noted   Amoxicillin Hives 02/10/2015   Iodinated contrast media Hives 03/05/2015   Lipitor [atorvastatin]  03/05/2015   Losartan  Other (See Comments) 04/03/2015   Plavix [clopidogrel] Hives 02/10/2015    Medications:  Current Facility-Administered Medications:    0.9 %  sodium chloride  infusion, , Intravenous, Continuous, Leavy Ned, MD   ceFAZolin  (ANCEF ) IVPB 2g/100 mL premix, 2 g, Intravenous, Once, Claudene Penne ORN, MD   chlorhexidine  (PERIDEX ) 0.12 % solution 15 mL, 15 mL, Mouth/Throat, Once **OR** Oral care mouth rinse, 15 mL, Mouth Rinse, Once, Leavy Ned, MD  Social History: Social History   Tobacco Use   Smoking status: Never   Smokeless tobacco: Never  Vaping Use   Vaping status: Never Used  Substance Use Topics   Alcohol use: No    Comment: occasional   Drug use: No    Family Medical History: History reviewed. No pertinent family history.  Physical Examination: Vitals:   01/05/24 0722  BP: 125/87  Pulse: 98  Resp: 16  Temp: 98.1 F (36.7 C)  SpO2: 98%    General: Patient is in no apparent distress. Attention to examination is appropriate.  Neck:   Supple.  Full range of motion.  Respiratory: Patient is breathing without any difficulty.   NEUROLOGICAL:     Awake, alert, oriented to person,  place, and time.  Speech is clear and fluent.   Cranial Nerves: Pupils equal round and reactive to light.  Facial tone is symmetric. Shoulder shrug is symmetric. Tongue protrusion is midline.  There is no pronator drift.  Motor Exam:  He does have significant foot drop on the right lower extremity with toe extension weakness.  He has mild eversion weakness.  His plantarflexion continues to be strong.  He has a severe Tinel's sign at the right peroneal nerve as across at the fibular neck.  Has noticeable foot drop and steppage gait while walking.  No evidence of hip drop.  Medical Decision Making    MR KNEE RIGHT WO CONTRAST Result Date: 12/10/2023 MR KNEE WITHOUT IV CONTRAST RIGHT COMPARISON: 05/21/2006 CLINICAL HISTORY: Foot drop. PULSE SEQUENCES: Ax PD FS, Sag T2 ACL, Sag PD FS, Cor PD FS & COR T1 FINDINGS: Bones: Moderate tricompartmental osteoarthrosis with full-thickness cartilage loss in the medial compartment. Small reactive joint effusion. Extensor mechanism is intact. Ligaments: There is mild increased signal in the cruciate ligaments likely reflective of mucoid degeneration. There are intact fibers. The MCL and  fibular collateral ligaments are intact. Menisci: Lateral meniscus is unremarkable. There is post meniscectomy change in the posterior horn and body of the medial meniscus with mild residual fragmentation of the posterior horn and body of uncertain clinical significance. No significant or displaced meniscal tear is appreciated. There is an indeterminate but stable dilated vascular structure in the popliteal fossa. This may be an atelectatic popliteal artery which measures 1.3 cm in maximum diameter. Correlation with arterial ultrasound if needed. Its stability suggests this is an indolent process. Otherwise the tibial and peroneal nerves are unremarkable. 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 Post meniscectomy changes in  the medial meniscus. There is fraying and fragmentation of the posterior horn and body of the medial meniscus with a small apical tears. These are of uncertain clinical significance. There is a dilated probable popliteal artery which is unchanged compared with 2008 making this is of uncertain clinical significance. There is a slight beaded appearance which could conceivably represent fibromuscular dysplasia. Correlation with popliteal ultrasound to further evaluate the vascular structures if clinically indicated. Electronically signed by: Norleen Satchel MD 12/10/2023 11:42 AM EDT RP Workstation: [id number]   NCV with EMG(electromyography) Result Date: 11/10/2023 Tobie Tonita POUR, DO     11/10/2023  4:01 PM [facility] [address] [place], [address]  [place], [place] [address] Tel: [phone] Fax: [phone] Test Date:  11/10/2023 Patient: Joseph Mann DOB: May 26, 1940 Physician: Tonita Tobie, DO Sex: Male Height: 6' 2 Ref Phys: Reeves Daisy, MD ID#: [id number]   Technician:  History: This is a 83 year old man referred for evaluation of right foot weakness. NCV & EMG Findings: Extensive electrodiagnostic testing of the right lower extremity and additional studies of the left shows: Right sural superficial peroneal sensory response is absent.  Left sural superficial peroneal and bilateral sural sensory responses are within normal limits. Right peroneal motor response at the extensor digitorum brevis is barely present and shows reduced amplitude at the tibialis anterior.  Left peroneal motor response at the extensor digitorum brevis is absent, normal at the tibialis anterior.  Bilateral tibial motor responses show reduced amplitudes. Tibial H reflex studies are absent bilaterally. Chronic motor axonal loss changes are seen affecting the right anterior tibialis and fibularis longus with fibrillation potentials.  Chronic motor axonal loss changes are also seen in the right S1 myotome.  Proximal and deep  muscles were not tested as the patient is on anticoagulation therapy. Impression: Active on chronic right peroneal mononeuropathy, nonlocalizable, severe.  Consider nerve ultrasound further evaluate the peroneal nerve. 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 ___________________________ Tonita Tobie, DO Nerve Conduction Studies  Stim Site NR Peak (ms) Norm Peak (ms) O-P Amp (V) Norm O-P Amp Left Sup Peroneal Anti Sensory (Ant Lat Mall)  32 C 12 cm    3.1 <4.6 4.2 >3 Right Sup Peroneal Anti Sensory (Ant Lat Mall)  32 C 12 cm *NR  <4.6  >3 Left Sural Anti Sensory (Lat Mall)  32 C Calf    3.2 <4.6 5.1 >3 Right Sural Anti Sensory (Lat Mall)  32 C Calf    3.3 <4.6 5.2 >3  Stim Site NR Onset (ms) Norm Onset (ms) O-P Amp (mV) Norm O-P Amp Site1 Site2 Delta-0 (ms) Dist (cm) Vel (m/s) Norm Vel (m/s) Left Peroneal Motor (Ext Dig Brev)  32 C Ankle *NR  <6.0  >2.5 B Fib Ankle  0.0  >40 B Fib *NR     Poplt B Fib  0.0  >  40 Poplt *NR           Right Peroneal Motor (Ext Dig Brev)  32 C Ankle    3.8 <6.0 *0.5 >2.5 B Fib Ankle 11.0 44.0 40 >40 B Fib    14.8  0.3  Poplt B Fib 2.3 10.0 43 >40 Poplt    17.1  0.3        Left Peroneal TA Motor (Tib Ant)  32 C Fib Head    3.1 <4.5 3.7 >3 Poplit Fib Head 2.3 10.0 43 >40 Poplit    5.4 <5.7 3.6        Right Peroneal TA Motor (Tib Ant)  32 C Fib Head    3.8 <4.5 *1.4 >3 Poplit Fib Head 1.7 10.0 59 >40 Poplit    5.5 <5.7 1.3        Left Tibial Motor (Abd Hall Brev)  32 C Ankle    4.1 <6.0 *1.2 >4 Knee Ankle 11.6 46.0 40 >40 Knee    15.7  0.8        Right Tibial Motor (Abd Hall Brev)  32 C Ankle    5.9 <6.0 *1.4 >4 Knee Ankle 11.2 45.0 40 >40 Knee    17.1  1.3        Electromyography  Side Muscle Ins.Act Fibs Fasc Recrt Amp Dur Poly Activation Comment Right AntTibialis Nml *1+ Nml *3- *1+ *1+ *1+ Nml N/A Right Gastroc Nml Nml Nml *1- *1+ *1+ *1+ Nml N/A Right RectFemoris Nml Nml Nml Nml Nml Nml Nml Nml N/A Right BicepsFemS Nml Nml Nml *1- *1+ *1+ *1+ Nml N/A Right  GluteusMed Nml Nml Nml Nml Nml Nml Nml Nml N/A Right Fibularis Long Nml Nml Nml *3- *1+ *1+ *1+ Nml N/A Waveforms:                        I have personally reviewed the images and electrodiagnostics and agree with the above interpretation.  Assessment and Plan: Joseph Mann is a pleasant 83 y.o. male with right-sided foot drop.  He has a history of right leg pain back pain and worsening right-sided foot drop.  This continues to be bothersome to him.  He has a steppage gait.  On physical examination he has significant foot drop with approximately 2 out of 5 strength.  Weakness in his toe extension..  Plan for a peroneal decompression,.MRI negative for nerve cyst.  The symptoms are causing a significant impact on the patient's life. I have utilized the care everywhere function in epic to review the outside records available from external health systems, and have personally reviewed relevant imaging and electrodiagnostic workup.   Thank you for involving me in the care of this patient.    Penne MICAEL Sharps MD/MSCR Neurosurgery - Peripheral Nerve Surgery

## 2024-01-05 NOTE — Discharge Instructions (Signed)
  Your surgeon has performed an operation on the nerves in your lower extremity. Many times, patients feel better immediately after surgery and can "overdo it." Even if you feel well, it is important that you follow these activity guidelines. If you do not let your nerve heal properly from the surgery, you can increase the chance of return of your symptoms. The following are instructions to help in your recovery once you have been discharged from the hospital.  It is normal for your nerves to feel increased "tingling" after surgery or a sensation that the nerve is "waking up".  This generally will resolve in a short period of time, within 1 to 2 weeks.  * It is ok to take NSAIDs after surgery.  Activity    Increase physical activity slowly as tolerated.  Taking short walks is encouraged, but avoid strenuous exercise. Do not jog, run, bicycle, lift weights, or participate in any other exercises unless specifically allowed by your doctor. Avoid prolonged sitting, including car rides.  You should not drive until no longer taking any new pain medication.  Do not return to work until the timing previously discussed in clinic  You may shower three days after your surgery.  After showering, lightly dab your incision dry. Do not take a tub bath or go swimming for 3 weeks, or until approved by your doctor at your follow-up appointment.  If you smoke, we strongly recommend that you quit.  Smoking has been proven to interfere with normal healing in your back and will dramatically reduce the success rate of your surgery. Please contact [facility] (800-QUIT-NOW) and use the resources at www.[facility].com for assistance in stopping smoking.  Surgical Incision   If you have a dressing on your incision, you may remove it three days after your surgery. Keep your incision area clean and dry.  Your sutures are under your skin and your wound is covered with surgical glue.  The glue should begin to peel away within  about a week.   Diet            You may return to your usual diet. Be sure to stay hydrated.  When to Contact us  Although your surgery and recovery will likely be uneventful, you may have some residual numbness, aches, and pains in your back and/or legs. This is normal and should improve in the next few weeks.  However, should you experience any of the following, contact us immediately: New numbness or weakness Pain that is progressively getting worse, and is not relieved by your pain medications or rest Bleeding, redness, swelling, pain, or drainage from surgical incision Chills or flu-like symptoms Fever greater than 101.0 F (38.3 C) Problems with bowel or bladder functions Difficulty breathing or shortness of breath Warmth, tenderness, or swelling in your calf  Contact Information How to contact us:  If you have any questions/concerns before or after surgery, you can reach [place] at [phone], or you can send a mychart message. We can be reached by phone or mychart 8am-4pm, Monday-Friday.  *Please note: Calls after 4pm are forwarded to a third party answering service. Mychart messages are not routinely monitored during evenings, weekends, and holidays. Please call our office to contact the answering service for urgent concerns during non-business hours.

## 2024-01-05 NOTE — Progress Notes (Signed)
 Referring Physician:  No referring provider defined for this encounter.  Primary Physician:  Lenon Layman ORN, MD  History of Present Illness: 01/05/2024 Joseph Mann is here today with a chief complaint of right-sided foot drop.  He has a history of chronic right lower extremity pain as well as outer thigh pain.  He has been having worsening foot drop and weakness in his right lower extremity.  Was seen by my partner Dr. Katrina who evaluated him and was concerned that he may have a peripheral issue rather than a clear spinal issue so he was sent to neurology.  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  He is here today to follow-up.  Continues to have weakness in the right lower extremity.  States that this weakness is the thing that bothers him the most.  He also gets tingling/weakness sensation in his right lower extremity that is quite bothersome.  He does get intermittent pain from his back down the posterior aspect of his leg but states that this is intermittent and is not his major concern.   Bowel/Bladder Dysfunction: none   Conservative measures:  Physical therapy: has not participated in PT Multimodal medical therapy including regular antiinflammatories: Prednisone , Gabapentin Injections: 06/24/2023 Right L5-S1 TESI 04/14/2023 Right L5-S1 TESI 02/17/2023 Right L5-S1 TESI   Past Surgery: R L4/5 surgery many years ago by Dr. Kathi Emil [name] has no symptoms of cervical myelopathy.   The symptoms are causing a significant impact on the patient's life.    I have utilized the care everywhere function in epic to review the outside records available from external health systems.    Review of Systems:  A 10 point review of systems is negative, except for the pertinent positives and negatives detailed in the HPI.  Past Medical History: Past Medical History:  Diagnosis Date   AAA (abdominal aortic  aneurysm)    Adenomatous colon polyp    Angina pectoris syndrome    Aortic atherosclerosis    BPH (benign prostatic hyperplasia)    Coronary artery disease    a.) s/p NSTEMI 08/06/2013 --> PCI 99% pLAD (4.0 x 23 mm Xience Alpine DES)   DDD (degenerative disc disease), lumbar    Depression    Diverticulosis    GERD (gastroesophageal reflux disease)    Hiatal hernia    Hyperlipidemia    Hypertension    Migraines    Multiple BILATERAL renal sinus cysts    NSTEMI (non-ST elevated myocardial infarction) (HCC) 08/06/2013   a.) LHC/PCI 08/08/2023 --> IRA was 99% pLAD (4.0 x 23 mm Xience Alpine DES)   On apixaban  therapy    Osteoarthritis    PAF (paroxysmal atrial fibrillation) (HCC)    a.) CHA2DS2VASc = 5 (age x 2, HTN, prior MI/vascular disease, T2DM) as of 01/03/2024; b.) rate/rhythm maintained on oral metoprolol  succinate; chronically anticoagulated with apixaban    Peroneal neuropathy at knee, right    Pulmonary hypertension (HCC)    Right foot drop    Status post right inguinal hernia repair    T2DM (type 2 diabetes mellitus) (HCC)     Past Surgical History: Past Surgical History:  Procedure Laterality Date   BACK SURGERY     CARDIAC CATHETERIZATION Left 05/15/2015   Procedure: Left Heart Cath and Coronary Angiography;  Surgeon: Cara JONETTA Lovelace, MD;  Location: [facility] INVASIVE CV LAB;  Service: Cardiovascular;  Laterality: Left;   CHOLECYSTECTOMY     COLONOSCOPY N/A  03/10/2015   Procedure: COLONOSCOPY;  Surgeon: Lamar ONEIDA Holmes, MD;  Location: [facility] ENDOSCOPY;  Service: Endoscopy;  Laterality: N/A;   IDDM  Vancomycin / Gentamycin Preop   CORONARY ANGIOPLASTY WITH STENT PLACEMENT Left 08/07/2013   Procedure: CORONARY ANGIOPLASTY WITH STENT PLACEMENT; Location: [facility]; Surgeon: Margie Lovelace, MD   ESOPHAGOGASTRODUODENOSCOPY (EGD) WITH PROPOFOL  N/A 05/22/2015   Procedure: ESOPHAGOGASTRODUODENOSCOPY (EGD) WITH PROPOFOL ;  Surgeon: Lamar ONEIDA Holmes, MD;  Location: [facility] ENDOSCOPY;   Service: Endoscopy;  Laterality: N/A;   ESOPHAGOGASTRODUODENOSCOPY (EGD) WITH PROPOFOL  N/A 07/05/2022   Procedure: ESOPHAGOGASTRODUODENOSCOPY (EGD) WITH PROPOFOL ;  Surgeon: Onita Elspeth Sharper, DO;  Location: [facility] ENDOSCOPY;  Service: Endoscopy;  Laterality: N/A;   INGUINAL HERNIA REPAIR Right    REPLACEMENT TOTAL KNEE Left    RIGHT/LEFT HEART CATH AND CORONARY ANGIOGRAPHY N/A 01/19/2022   Procedure: RIGHT/LEFT HEART CATH AND CORONARY ANGIOGRAPHY;  Surgeon: Lovelace Cara BIRCH, MD;  Location: [facility] INVASIVE CV LAB;  Service: Cardiovascular;  Laterality: N/A;    Allergies: Allergies as of 12/13/2023 - Review Complete 12/05/2023  Allergen Reaction Noted   Amoxicillin Hives 02/10/2015   Iodinated contrast media Hives 03/05/2015   Lipitor [atorvastatin]  03/05/2015   Losartan  Other (See Comments) 04/03/2015   Plavix [clopidogrel] Hives 02/10/2015    Medications:  Current Facility-Administered Medications:    0.9 %  sodium chloride  infusion, , Intravenous, Continuous, Leavy Ned, MD   ceFAZolin  (ANCEF ) IVPB 2g/100 mL premix, 2 g, Intravenous, Once, Claudene Penne ORN, MD   chlorhexidine  (PERIDEX ) 0.12 % solution 15 mL, 15 mL, Mouth/Throat, Once **OR** Oral care mouth rinse, 15 mL, Mouth Rinse, Once, Leavy Ned, MD  Social History: Social History   Tobacco Use   Smoking status: Never   Smokeless tobacco: Never  Vaping Use   Vaping status: Never Used  Substance Use Topics   Alcohol use: No    Comment: occasional   Drug use: No    Family Medical History: History reviewed. No pertinent family history.  Physical Examination: Vitals:   01/05/24 0722  BP: 125/87  Pulse: 98  Resp: 16  Temp: 98.1 F (36.7 C)  SpO2: 98%    General: Patient is in no apparent distress. Attention to examination is appropriate.  Neck:   Supple.  Full range of motion.  Respiratory: Patient is breathing without any difficulty.   NEUROLOGICAL:     Awake, alert, oriented to person,  place, and time.  Speech is clear and fluent.   Cranial Nerves: Pupils equal round and reactive to light.  Facial tone is symmetric. Shoulder shrug is symmetric. Tongue protrusion is midline.  There is no pronator drift.  Motor Exam:  He does have significant foot drop on the right lower extremity with toe extension weakness.  He has mild eversion weakness.  His plantarflexion continues to be strong.  He has a severe Tinel's sign at the right peroneal nerve as across at the fibular neck.  Has noticeable foot drop and steppage gait while walking.  No evidence of hip drop.  Medical Decision Making    MR KNEE RIGHT WO CONTRAST Result Date: 12/10/2023 MR KNEE WITHOUT IV CONTRAST RIGHT COMPARISON: 05/21/2006 CLINICAL HISTORY: Foot drop. PULSE SEQUENCES: Ax PD FS, Sag T2 ACL, Sag PD FS, Cor PD FS & COR T1 FINDINGS: Bones: Moderate tricompartmental osteoarthrosis with full-thickness cartilage loss in the medial compartment. Small reactive joint effusion. Extensor mechanism is intact. Ligaments: There is mild increased signal in the cruciate ligaments likely reflective of mucoid degeneration. There are intact fibers. The MCL and  fibular collateral ligaments are intact. Menisci: Lateral meniscus is unremarkable. There is post meniscectomy change in the posterior horn and body of the medial meniscus with mild residual fragmentation of the posterior horn and body of uncertain clinical significance. No significant or displaced meniscal tear is appreciated. There is an indeterminate but stable dilated vascular structure in the popliteal fossa. This may be an atelectatic popliteal artery which measures 1.3 cm in maximum diameter. Correlation with arterial ultrasound if needed. Its stability suggests this is an indolent process. Otherwise the tibial and peroneal nerves are unremarkable. 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 Post meniscectomy changes in  the medial meniscus. There is fraying and fragmentation of the posterior horn and body of the medial meniscus with a small apical tears. These are of uncertain clinical significance. There is a dilated probable popliteal artery which is unchanged compared with 2008 making this is of uncertain clinical significance. There is a slight beaded appearance which could conceivably represent fibromuscular dysplasia. Correlation with popliteal ultrasound to further evaluate the vascular structures if clinically indicated. Electronically signed by: Norleen Satchel MD 12/10/2023 11:42 AM EDT RP Workstation: [id number]   NCV with EMG(electromyography) Result Date: 11/10/2023 Tobie Tonita POUR, DO     11/10/2023  4:01 PM [facility] [address] [place], [address]  [place], [place] [address] Tel: [phone] Fax: [phone] Test Date:  11/10/2023 Patient: Jaben Benegas DOB: May 26, 1940 Physician: Tonita Tobie, DO Sex: Male Height: 6' 2 Ref Phys: Reeves Daisy, MD ID#: [id number]   Technician:  History: This is a 83 year old man referred for evaluation of right foot weakness. NCV & EMG Findings: Extensive electrodiagnostic testing of the right lower extremity and additional studies of the left shows: Right sural superficial peroneal sensory response is absent.  Left sural superficial peroneal and bilateral sural sensory responses are within normal limits. Right peroneal motor response at the extensor digitorum brevis is barely present and shows reduced amplitude at the tibialis anterior.  Left peroneal motor response at the extensor digitorum brevis is absent, normal at the tibialis anterior.  Bilateral tibial motor responses show reduced amplitudes. Tibial H reflex studies are absent bilaterally. Chronic motor axonal loss changes are seen affecting the right anterior tibialis and fibularis longus with fibrillation potentials.  Chronic motor axonal loss changes are also seen in the right S1 myotome.  Proximal and deep  muscles were not tested as the patient is on anticoagulation therapy. Impression: Active on chronic right peroneal mononeuropathy, nonlocalizable, severe.  Consider nerve ultrasound further evaluate the peroneal nerve. 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 ___________________________ Tonita Tobie, DO Nerve Conduction Studies  Stim Site NR Peak (ms) Norm Peak (ms) O-P Amp (V) Norm O-P Amp Left Sup Peroneal Anti Sensory (Ant Lat Mall)  32 C 12 cm    3.1 <4.6 4.2 >3 Right Sup Peroneal Anti Sensory (Ant Lat Mall)  32 C 12 cm *NR  <4.6  >3 Left Sural Anti Sensory (Lat Mall)  32 C Calf    3.2 <4.6 5.1 >3 Right Sural Anti Sensory (Lat Mall)  32 C Calf    3.3 <4.6 5.2 >3  Stim Site NR Onset (ms) Norm Onset (ms) O-P Amp (mV) Norm O-P Amp Site1 Site2 Delta-0 (ms) Dist (cm) Vel (m/s) Norm Vel (m/s) Left Peroneal Motor (Ext Dig Brev)  32 C Ankle *NR  <6.0  >2.5 B Fib Ankle  0.0  >40 B Fib *NR     Poplt B Fib  0.0  >  40 Poplt *NR           Right Peroneal Motor (Ext Dig Brev)  32 C Ankle    3.8 <6.0 *0.5 >2.5 B Fib Ankle 11.0 44.0 40 >40 B Fib    14.8  0.3  Poplt B Fib 2.3 10.0 43 >40 Poplt    17.1  0.3        Left Peroneal TA Motor (Tib Ant)  32 C Fib Head    3.1 <4.5 3.7 >3 Poplit Fib Head 2.3 10.0 43 >40 Poplit    5.4 <5.7 3.6        Right Peroneal TA Motor (Tib Ant)  32 C Fib Head    3.8 <4.5 *1.4 >3 Poplit Fib Head 1.7 10.0 59 >40 Poplit    5.5 <5.7 1.3        Left Tibial Motor (Abd Hall Brev)  32 C Ankle    4.1 <6.0 *1.2 >4 Knee Ankle 11.6 46.0 40 >40 Knee    15.7  0.8        Right Tibial Motor (Abd Hall Brev)  32 C Ankle    5.9 <6.0 *1.4 >4 Knee Ankle 11.2 45.0 40 >40 Knee    17.1  1.3        Electromyography  Side Muscle Ins.Act Fibs Fasc Recrt Amp Dur Poly Activation Comment Right AntTibialis Nml *1+ Nml *3- *1+ *1+ *1+ Nml N/A Right Gastroc Nml Nml Nml *1- *1+ *1+ *1+ Nml N/A Right RectFemoris Nml Nml Nml Nml Nml Nml Nml Nml N/A Right BicepsFemS Nml Nml Nml *1- *1+ *1+ *1+ Nml N/A Right  GluteusMed Nml Nml Nml Nml Nml Nml Nml Nml N/A Right Fibularis Long Nml Nml Nml *3- *1+ *1+ *1+ Nml N/A Waveforms:                        I have personally reviewed the images and electrodiagnostics and agree with the above interpretation.  Assessment and Plan: Mr. Brindle is a pleasant 83 y.o. male with right-sided foot drop.  He has a history of right leg pain back pain and worsening right-sided foot drop.  This continues to be bothersome to him.  He has a steppage gait.  On physical examination he has significant foot drop with approximately 2 out of 5 strength.  Weakness in his toe extension..  Plan for a peroneal decompression,.MRI negative for nerve cyst.  The symptoms are causing a significant impact on the patient's life. I have utilized the care everywhere function in epic to review the outside records available from external health systems, and have personally reviewed relevant imaging and electrodiagnostic workup.   Thank you for involving me in the care of this patient.    Penne MICAEL Sharps MD/MSCR Neurosurgery - Peripheral Nerve Surgery

## 2024-01-05 NOTE — Anesthesia Postprocedure Evaluation (Signed)
 Anesthesia Post Note  Patient: Joseph Mann  Procedure(s) Performed: PERONEAL NERVE DECOMPRESSION (Right)  Patient location during evaluation: PACU Anesthesia Type: General Level of consciousness: awake and alert Pain management: pain level controlled Vital Signs Assessment: post-procedure vital signs reviewed and stable Respiratory status: spontaneous breathing, nonlabored ventilation and respiratory function stable Cardiovascular status: blood pressure returned to baseline and stable Postop Assessment: no apparent nausea or vomiting Anesthetic complications: no   No notable events documented.   Last Vitals:  Vitals:   01/05/24 0900 01/05/24 0909  BP: 107/80 104/72  Pulse: 73 85  Resp: 16 16  Temp:  (!) 36.2 C  SpO2: 97% 99%    Last Pain:  Vitals:   01/05/24 0909  TempSrc: Temporal  PainSc: 0-No pain                 Camellia Merilee Louder

## 2024-01-05 NOTE — Op Note (Signed)
 Indications: Patient was found to have a peroneal neuropathy on clinical exam and with electrodiagnostic and ultrasound testing.  Given their persistent symptoms in the face of conservative management, they were taken to the OR for peroneal nerve decompression.  Findings: Severe compression and flattening of the peroneal nerve at the posterior crural ligament   Pre-Op Diagnosis Codes:     * Peroneal neuropathy at knee, right [G57.31]    * Right foot drop [M21.371]   Postop Diagnosis Codes:  Same      EBL: 5cc IVF: See anesthesia report Drains: none Disposition:Stable to PACU Complications: none   No foley catheter was placed.     Preoperative Note: The patient was seen again in the preoperative area.  They continue to have symptoms of peroneal neuropathy that were causing a significant impact on their lives.  They did not have any improvement with conservative management therefore we planned for a peroneal nerve decompression.  Risk of surgery is discussed and include: Infection, bleeding, wound healing issues, nerve injury, pain, failure to relieve the symptoms, need for further surgery.   Procedure:  1) Right sided common peroneal nerve decompression at the fibular neck   Procedure: After obtaining informed consent, the patient taken to the operating room, placed in supine position, monitored anesthesia care was induced.  They were given preoperative antibiotics.  Prepped and draped in the usual fashion.  Comprehensive timeout was performed verifying the patient's name, MRN, planned procedure.   Patient was induced and monitored anesthesia care.  They were supine on the bed.  Her hip was padded. Down leg was point padded.  They were then prepped and draped in the usual fashion.  The nerve was clearly palpable inferior to the head of the fibula as it crossed over the fibular neck and under the lateral compartment musculature.  The skin was infiltrated with local anesthetic.  The skin  was opened sharply.  This brought us  down to the investing fascia.  Investing fascia was opened sharply with care taken to spare any crossing nerves or vessels.  We then followed the investing fascia deep to the lateral compartment.  We were able to identify the fascia over top of the lateral compartment.  This was opened sharply.  It was followed back to the posterior crural ligament.  Once the posterior crural ligament was identified it was isolated.  It appeared to be quite hypertrophied and compressive of the peroneal nerve at the fibular neck.  This was divided and resulted in significant decompression of the peroneal nerve.  We then followed the nerve proximally there is a few bands of proximal compression noted and the nerve was quite enlarged.  We followed this back to the popliteal fossa.  We then followed the distally and released any further fascial compression bands at the fibular neck.  The wound was copiously irrigated.  The wound was then closed in multiple layers with skin glue on the most superficial aspect.  No immediate complications.   Sponge and pattie counts were correct at the end of the procedure.    I performed this procedure without an Designer, television/film set.  Penne MICAEL Sharps, MD/MSCR

## 2024-01-06 ENCOUNTER — Encounter: Payer: Self-pay | Admitting: Neurosurgery

## 2024-01-18 ENCOUNTER — Encounter: Payer: Self-pay | Admitting: Physician Assistant

## 2024-01-18 ENCOUNTER — Ambulatory Visit: Admitting: Physician Assistant

## 2024-01-18 VITALS — BP 130/84 | Temp 97.9°F | Wt 194.6 lb

## 2024-01-18 DIAGNOSIS — G5731 Lesion of lateral popliteal nerve, right lower limb: Secondary | ICD-10-CM

## 2024-01-18 DIAGNOSIS — Z09 Encounter for follow-up examination after completed treatment for conditions other than malignant neoplasm: Secondary | ICD-10-CM

## 2024-01-18 NOTE — Progress Notes (Signed)
   REFERRING PHYSICIAN:  Lenon Layman ORN, Md [address] [facility] [place],  [place] [address]  DOS: 01/05/24, right peroneal decompression  HISTORY OF PRESENT ILLNESS: Joseph Mann is approximately 2 weeks status post right peroneal nerve decompression. he is doing well.  He has had no pain since his surgery and feels as though his strength is already slightly improved compared to preop.  PHYSICAL EXAMINATION:  General: Patient is well developed, well nourished, calm, collected, and in no apparent distress.   NEUROLOGICAL:  General: In no acute distress.   Awake, alert, oriented to person, place, and time.  Pupils equal round and reactive to light.  Facial tone is symmetric.     Strength:  Dorsiflexion remains at a 3.  Other strength remains to baseline.  Incision c/d/i   ROS (Neurologic):  Negative except as noted above  IMAGING: No new imaging  ASSESSMENT/PLAN:  Joseph Mann is doing well approximately 2 weeks after right peroneal nerve decompression. he will follow up in one month for his 6 week visit. I have advised the patient to lift up to 10 pounds until 6 weeks after surgery, then increase up to 25 pounds until 12 weeks after surgery.  After 12 weeks post-op, the patient advised to increase activity as tolerated.  Advised to contact the office if any questions or concerns arise.  Lyle Decamp PA-C Department of neurosurgery

## 2024-02-20 ENCOUNTER — Ambulatory Visit: Admitting: Neurosurgery

## 2024-02-20 ENCOUNTER — Encounter: Payer: Self-pay | Admitting: Neurosurgery

## 2024-02-20 VITALS — BP 126/80 | Ht 74.0 in | Wt 194.0 lb

## 2024-02-20 DIAGNOSIS — Z09 Encounter for follow-up examination after completed treatment for conditions other than malignant neoplasm: Secondary | ICD-10-CM

## 2024-02-20 DIAGNOSIS — G5731 Lesion of lateral popliteal nerve, right lower limb: Secondary | ICD-10-CM

## 2024-02-20 NOTE — Progress Notes (Signed)
   REFERRING PHYSICIAN:  Lenon Layman ORN, Md [address] [facility] [place],  [place] [address]  DOS: 01/05/24, right peroneal decompression  HISTORY OF PRESENT ILLNESS:   Discussed the use of AI scribe software for clinical note transcription with the patient, who gave verbal consent to proceed.  History of Present Illness Joseph Mann is approximately 6 weeks status post right peroneal nerve decompression. Joseph Mann is an 83 year old male who presents for follow-up after leg surgery.  Pain in his leg has improved since the surgery and is less severe than before the procedure. There is no worsening of symptoms. Dorsiflexion has improved since the last visit, and he feels that his leg is stronger. He is able to turn his ankle out more than before.  He engages in stretching exercises, which contribute to the improvement in leg strength and flexibility.   PHYSICAL EXAMINATION:  General: Patient is well developed, well nourished, calm, collected, and in no apparent distress.   NEUROLOGICAL:  General: In no acute distress.   Awake, alert, oriented to person, place, and time.  Pupils equal round and reactive to light.  Facial tone is symmetric.     Strength:  Dorsiflexion limited due to decreased ROM from prior achilles rupture. On confrontational examination has full resistance when at his maximal right DF.  Other strength remains to baseline.  Incision c/d/i   ROS (Neurologic):  Negative except as noted above  IMAGING: No new imaging Assessment & Plan Right peroneal neuropathy Post-surgical improvement in pain and motor function. Dorsiflexion strength improved. Muscle strength and peroneal muscle recovery progressing well. - Continue stretching exercises to improve ankle flexibility and function. - No follow-up unless symptoms worsen or new issues arise.  Penne ORN Sharps MD Department of neurosurgery

## 2024-03-26 ENCOUNTER — Encounter: Admitting: Physician Assistant

## 2024-05-17 ENCOUNTER — Other Ambulatory Visit: Payer: Self-pay | Admitting: Internal Medicine

## 2024-05-17 DIAGNOSIS — I272 Pulmonary hypertension, unspecified: Secondary | ICD-10-CM

## 2024-06-04 ENCOUNTER — Ambulatory Visit
